# Patient Record
Sex: Female | Born: 1950 | Race: White | Hispanic: No | Marital: Married | Smoking: Former smoker
Health system: Southern US, Community
[De-identification: ages and names within clinical notes are randomized; demographics above are authoritative.]

## PROBLEM LIST (undated history)

## (undated) DIAGNOSIS — I10 Essential (primary) hypertension: Secondary | ICD-10-CM

## (undated) DIAGNOSIS — M791 Myalgia, unspecified site: Secondary | ICD-10-CM

## (undated) DIAGNOSIS — Z86010 Personal history of colonic polyps: Secondary | ICD-10-CM

## (undated) DIAGNOSIS — Z780 Asymptomatic menopausal state: Secondary | ICD-10-CM

## (undated) DIAGNOSIS — K2270 Barrett's esophagus without dysplasia: Secondary | ICD-10-CM

## (undated) DIAGNOSIS — C50919 Malignant neoplasm of unspecified site of unspecified female breast: Secondary | ICD-10-CM

## (undated) DIAGNOSIS — Z720 Tobacco use: Secondary | ICD-10-CM

## (undated) DIAGNOSIS — B029 Zoster without complications: Secondary | ICD-10-CM

## (undated) DIAGNOSIS — R519 Headache, unspecified: Secondary | ICD-10-CM

## (undated) DIAGNOSIS — E785 Hyperlipidemia, unspecified: Secondary | ICD-10-CM

## (undated) DIAGNOSIS — Z923 Personal history of irradiation: Secondary | ICD-10-CM

## (undated) DIAGNOSIS — R51 Headache: Secondary | ICD-10-CM

## (undated) DIAGNOSIS — N840 Polyp of corpus uteri: Secondary | ICD-10-CM

## (undated) DIAGNOSIS — A630 Anogenital (venereal) warts: Secondary | ICD-10-CM

## (undated) DIAGNOSIS — M1990 Unspecified osteoarthritis, unspecified site: Secondary | ICD-10-CM

## (undated) DIAGNOSIS — N952 Postmenopausal atrophic vaginitis: Secondary | ICD-10-CM

## (undated) DIAGNOSIS — I209 Angina pectoris, unspecified: Secondary | ICD-10-CM

## (undated) DIAGNOSIS — K219 Gastro-esophageal reflux disease without esophagitis: Secondary | ICD-10-CM

## (undated) DIAGNOSIS — E119 Type 2 diabetes mellitus without complications: Secondary | ICD-10-CM

## (undated) DIAGNOSIS — J449 Chronic obstructive pulmonary disease, unspecified: Secondary | ICD-10-CM

## (undated) DIAGNOSIS — IMO0002 Reserved for concepts with insufficient information to code with codable children: Secondary | ICD-10-CM

## (undated) DIAGNOSIS — K635 Polyp of colon: Secondary | ICD-10-CM

## (undated) HISTORY — DX: Anogenital (venereal) warts: A63.0

## (undated) HISTORY — DX: Polyp of corpus uteri: N84.0

## (undated) HISTORY — DX: Headache, unspecified: R51.9

## (undated) HISTORY — DX: Asymptomatic menopausal state: Z78.0

## (undated) HISTORY — PX: APPENDECTOMY: SHX54

## (undated) HISTORY — DX: Type 2 diabetes mellitus without complications: E11.9

## (undated) HISTORY — DX: Unspecified osteoarthritis, unspecified site: M19.90

## (undated) HISTORY — DX: Polyp of colon: K63.5

## (undated) HISTORY — DX: Postmenopausal atrophic vaginitis: N95.2

## (undated) HISTORY — DX: Essential (primary) hypertension: I10

## (undated) HISTORY — DX: Reserved for concepts with insufficient information to code with codable children: IMO0002

## (undated) HISTORY — PX: TONSILLECTOMY: SUR1361

## (undated) HISTORY — PX: ESOPHAGOGASTRODUODENOSCOPY: SHX1529

## (undated) HISTORY — DX: Barrett's esophagus without dysplasia: K22.70

## (undated) HISTORY — DX: Chronic obstructive pulmonary disease, unspecified: J44.9

## (undated) HISTORY — DX: Malignant neoplasm of unspecified site of unspecified female breast: C50.919

## (undated) HISTORY — PX: COLONOSCOPY: SHX174

## (undated) HISTORY — DX: Gastro-esophageal reflux disease without esophagitis: K21.9

## (undated) HISTORY — DX: Zoster without complications: B02.9

## (undated) HISTORY — DX: Tobacco use: Z72.0

## (undated) HISTORY — DX: Hyperlipidemia, unspecified: E78.5

## (undated) HISTORY — DX: Myalgia, unspecified site: M79.10

## (undated) HISTORY — DX: Headache: R51

---
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
## 2004-01-01 ENCOUNTER — Ambulatory Visit: Payer: Self-pay | Admitting: Gastroenterology

## 2005-10-10 DIAGNOSIS — B029 Zoster without complications: Secondary | ICD-10-CM

## 2005-10-10 HISTORY — DX: Zoster without complications: B02.9

## 2007-12-08 ENCOUNTER — Ambulatory Visit: Payer: Self-pay | Admitting: Internal Medicine

## 2007-12-16 ENCOUNTER — Ambulatory Visit: Payer: Self-pay | Admitting: Physician Assistant

## 2008-04-19 ENCOUNTER — Ambulatory Visit: Payer: Self-pay | Admitting: Gastroenterology

## 2011-01-29 ENCOUNTER — Ambulatory Visit: Payer: Self-pay | Admitting: Internal Medicine

## 2011-02-10 ENCOUNTER — Ambulatory Visit: Payer: Self-pay | Admitting: Internal Medicine

## 2011-02-10 DIAGNOSIS — Z923 Personal history of irradiation: Secondary | ICD-10-CM

## 2011-02-10 HISTORY — DX: Personal history of irradiation: Z92.3

## 2011-02-10 HISTORY — PX: BREAST BIOPSY: SHX20

## 2011-03-13 ENCOUNTER — Ambulatory Visit: Payer: Self-pay | Admitting: Internal Medicine

## 2011-04-10 ENCOUNTER — Ambulatory Visit: Payer: Self-pay | Admitting: Internal Medicine

## 2011-04-10 DIAGNOSIS — IMO0002 Reserved for concepts with insufficient information to code with codable children: Secondary | ICD-10-CM

## 2011-04-10 HISTORY — DX: Reserved for concepts with insufficient information to code with codable children: IMO0002

## 2011-04-14 ENCOUNTER — Ambulatory Visit: Payer: Self-pay | Admitting: Internal Medicine

## 2011-04-14 LAB — CBC WITH DIFFERENTIAL/PLATELET
Eosinophil %: 3 %
HCT: 44.2 % (ref 35.0–47.0)
Lymphocyte #: 2 10*3/uL (ref 1.0–3.6)
MCV: 88 fL (ref 80–100)
Monocyte %: 7.3 %
Neutrophil #: 3.1 10*3/uL (ref 1.4–6.5)
RBC: 5.01 10*6/uL (ref 3.80–5.20)
RDW: 12.4 % (ref 11.5–14.5)
WBC: 5.8 10*3/uL (ref 3.6–11.0)

## 2011-04-14 LAB — URINALYSIS, COMPLETE
Bacteria: NONE SEEN
Bilirubin,UR: NEGATIVE
Blood: NEGATIVE
Glucose,UR: NEGATIVE mg/dL (ref 0–75)
Ketone: NEGATIVE
Nitrite: NEGATIVE
Protein: NEGATIVE
RBC,UR: <1 /HPF (ref 0–5)
Transitional Epi: <1
WBC UR: 3 /HPF (ref 0–5)

## 2011-04-14 LAB — HEPATIC FUNCTION PANEL A (ARMC)
Albumin: 3.8 g/dL (ref 3.4–5.0)
Alkaline Phosphatase: 90 U/L (ref 50–136)
Bilirubin, Direct: <0.1 mg/dL (ref 0.00–0.20)
Bilirubin,Total: 0.5 mg/dL (ref 0.2–1.0)
SGOT(AST): 19 U/L (ref 15–37)
Total Protein: 7.2 g/dL (ref 6.4–8.2)

## 2011-04-14 LAB — BASIC METABOLIC PANEL
BUN: 12 mg/dL (ref 7–18)
Calcium, Total: 9.1 mg/dL (ref 8.5–10.1)
Chloride: 108 mmol/L — ABNORMAL HIGH (ref 98–107)
Creatinine: 0.49 mg/dL — ABNORMAL LOW (ref 0.60–1.30)
Osmolality: 285 (ref 275–301)
Potassium: 4.2 mmol/L (ref 3.5–5.1)

## 2011-04-15 LAB — URINE CULTURE

## 2011-04-29 ENCOUNTER — Ambulatory Visit: Payer: Self-pay | Admitting: Obstetrics and Gynecology

## 2011-06-23 ENCOUNTER — Ambulatory Visit: Payer: Self-pay | Admitting: Internal Medicine

## 2011-06-24 ENCOUNTER — Ambulatory Visit: Payer: Self-pay | Admitting: Internal Medicine

## 2011-08-10 DIAGNOSIS — C50919 Malignant neoplasm of unspecified site of unspecified female breast: Secondary | ICD-10-CM

## 2011-08-10 HISTORY — DX: Malignant neoplasm of unspecified site of unspecified female breast: C50.919

## 2011-10-01 ENCOUNTER — Ambulatory Visit: Payer: Self-pay | Admitting: Surgery

## 2011-11-09 ENCOUNTER — Ambulatory Visit: Payer: Self-pay | Admitting: Surgery

## 2011-11-12 ENCOUNTER — Ambulatory Visit: Payer: Self-pay | Admitting: Surgery

## 2011-11-12 LAB — CBC WITH DIFFERENTIAL/PLATELET
Eosinophil #: 0.2 10*3/uL (ref 0.0–0.7)
Eosinophil %: 2.7 %
Lymphocyte #: 2.9 10*3/uL (ref 1.0–3.6)
Lymphocyte %: 38.9 %
MCH: 28.8 pg (ref 26.0–34.0)
MCHC: 33 g/dL (ref 32.0–36.0)
Monocyte %: 8.3 %
Neutrophil %: 49 %
Platelet: 236 10*3/uL (ref 150–440)
RBC: 4.66 10*6/uL (ref 3.80–5.20)

## 2011-11-12 LAB — BASIC METABOLIC PANEL
Anion Gap: 7 (ref 7–16)
BUN: 10 mg/dL (ref 7–18)
Calcium, Total: 9.1 mg/dL (ref 8.5–10.1)
Chloride: 107 mmol/L (ref 98–107)
Co2: 29 mmol/L (ref 21–32)
Creatinine: 0.73 mg/dL (ref 0.60–1.30)
EGFR (African American): >60
Glucose: 82 mg/dL (ref 65–99)
Osmolality: 283 (ref 275–301)
Potassium: 4.1 mmol/L (ref 3.5–5.1)

## 2011-11-19 ENCOUNTER — Ambulatory Visit: Payer: Self-pay | Admitting: Surgery

## 2011-11-19 HISTORY — PX: BREAST LUMPECTOMY: SHX2

## 2011-11-20 LAB — PATHOLOGY REPORT

## 2011-11-30 ENCOUNTER — Ambulatory Visit: Payer: Self-pay | Admitting: Oncology

## 2011-12-04 LAB — PATHOLOGY REPORT

## 2011-12-11 ENCOUNTER — Ambulatory Visit: Payer: Self-pay | Admitting: Oncology

## 2011-12-23 LAB — CBC CANCER CENTER
Eosinophil %: 4.2 %
Lymphocyte %: 34.6 %
MCH: 29.3 pg (ref 26.0–34.0)
MCV: 88 fL (ref 80–100)
Monocyte #: 0.4 x10 3/mm (ref 0.2–0.9)
Monocyte %: 7.4 %
Neutrophil %: 52.6 %
Platelet: 216 x10 3/mm (ref 150–440)
RBC: 4.79 10*6/uL (ref 3.80–5.20)
WBC: 5.2 x10 3/mm (ref 3.6–11.0)

## 2012-01-06 LAB — CBC CANCER CENTER
Basophil #: 0.1 x10 3/mm (ref 0.0–0.1)
Basophil %: 1.3 %
Eosinophil %: 4 %
HCT: 41.3 % (ref 35.0–47.0)
HGB: 13.9 g/dL (ref 12.0–16.0)
MCH: 29.1 pg (ref 26.0–34.0)
MCV: 87 fL (ref 80–100)
Monocyte %: 9.7 %
Neutrophil #: 3 x10 3/mm (ref 1.4–6.5)
RBC: 4.78 10*6/uL (ref 3.80–5.20)

## 2012-01-10 ENCOUNTER — Ambulatory Visit: Payer: Self-pay | Admitting: Oncology

## 2012-01-13 LAB — CBC CANCER CENTER
Basophil #: 0.1 x10 3/mm (ref 0.0–0.1)
Basophil %: 1.2 %
Eosinophil #: 0.2 x10 3/mm (ref 0.0–0.7)
HCT: 40.8 % (ref 35.0–47.0)
HGB: 14 g/dL (ref 12.0–16.0)
Lymphocyte #: 1.5 x10 3/mm (ref 1.0–3.6)
Lymphocyte %: 29.1 %
MCH: 29.7 pg (ref 26.0–34.0)
Monocyte %: 9.1 %
Neutrophil #: 2.9 x10 3/mm (ref 1.4–6.5)
Platelet: 191 x10 3/mm (ref 150–440)
RBC: 4.7 10*6/uL (ref 3.80–5.20)

## 2012-01-20 LAB — CBC CANCER CENTER
Basophil #: 0.1 x10 3/mm (ref 0.0–0.1)
Eosinophil #: 0.2 x10 3/mm (ref 0.0–0.7)
Eosinophil %: 3.8 %
HGB: 14.3 g/dL (ref 12.0–16.0)
MCH: 29.3 pg (ref 26.0–34.0)
MCHC: 33.6 g/dL (ref 32.0–36.0)
MCV: 87 fL (ref 80–100)
Neutrophil #: 3.7 x10 3/mm (ref 1.4–6.5)
Neutrophil %: 63.5 %
Platelet: 203 x10 3/mm (ref 150–440)

## 2012-01-27 LAB — CBC CANCER CENTER
Basophil %: 1.3 %
Eosinophil %: 7.3 %
HCT: 42.4 % (ref 35.0–47.0)
HGB: 14.3 g/dL (ref 12.0–16.0)
Lymphocyte #: 1 x10 3/mm (ref 1.0–3.6)
Lymphocyte %: 23.2 %
MCHC: 33.8 g/dL (ref 32.0–36.0)
Monocyte #: 0.4 x10 3/mm (ref 0.2–0.9)
Monocyte %: 8.9 %
Neutrophil #: 2.7 x10 3/mm (ref 1.4–6.5)
Neutrophil %: 59.3 %
RBC: 4.93 10*6/uL (ref 3.80–5.20)
WBC: 4.5 x10 3/mm (ref 3.6–11.0)

## 2012-02-10 ENCOUNTER — Ambulatory Visit: Payer: Self-pay | Admitting: Oncology

## 2012-03-12 ENCOUNTER — Ambulatory Visit: Payer: Self-pay | Admitting: Oncology

## 2012-04-09 ENCOUNTER — Ambulatory Visit: Payer: Self-pay | Admitting: Oncology

## 2012-05-10 ENCOUNTER — Ambulatory Visit: Payer: Self-pay | Admitting: Oncology

## 2012-05-13 ENCOUNTER — Ambulatory Visit: Payer: Self-pay | Admitting: Internal Medicine

## 2012-05-17 ENCOUNTER — Ambulatory Visit: Payer: Self-pay | Admitting: Internal Medicine

## 2012-06-09 ENCOUNTER — Ambulatory Visit: Payer: Self-pay | Admitting: Oncology

## 2012-09-01 ENCOUNTER — Ambulatory Visit: Payer: Self-pay | Admitting: Oncology

## 2012-09-05 ENCOUNTER — Ambulatory Visit: Payer: Self-pay | Admitting: Internal Medicine

## 2012-12-08 ENCOUNTER — Ambulatory Visit: Payer: Self-pay | Admitting: Gastroenterology

## 2012-12-12 LAB — PATHOLOGY REPORT

## 2013-04-09 LAB — HM COLONOSCOPY

## 2013-04-10 DIAGNOSIS — Z853 Personal history of malignant neoplasm of breast: Secondary | ICD-10-CM | POA: Insufficient documentation

## 2013-04-10 DIAGNOSIS — N840 Polyp of corpus uteri: Secondary | ICD-10-CM | POA: Insufficient documentation

## 2013-04-10 DIAGNOSIS — R918 Other nonspecific abnormal finding of lung field: Secondary | ICD-10-CM | POA: Insufficient documentation

## 2013-04-10 DIAGNOSIS — Z8719 Personal history of other diseases of the digestive system: Secondary | ICD-10-CM | POA: Insufficient documentation

## 2013-04-10 DIAGNOSIS — E119 Type 2 diabetes mellitus without complications: Secondary | ICD-10-CM | POA: Insufficient documentation

## 2013-04-10 DIAGNOSIS — G43909 Migraine, unspecified, not intractable, without status migrainosus: Secondary | ICD-10-CM | POA: Insufficient documentation

## 2013-04-10 DIAGNOSIS — Z86010 Personal history of colonic polyps: Secondary | ICD-10-CM | POA: Insufficient documentation

## 2013-04-10 DIAGNOSIS — M791 Myalgia, unspecified site: Secondary | ICD-10-CM | POA: Insufficient documentation

## 2013-04-10 DIAGNOSIS — J449 Chronic obstructive pulmonary disease, unspecified: Secondary | ICD-10-CM | POA: Insufficient documentation

## 2013-04-10 DIAGNOSIS — R0789 Other chest pain: Secondary | ICD-10-CM | POA: Insufficient documentation

## 2013-05-10 LAB — HM MAMMOGRAPHY

## 2013-05-18 ENCOUNTER — Ambulatory Visit: Payer: Self-pay | Admitting: Internal Medicine

## 2013-06-29 DIAGNOSIS — I10 Essential (primary) hypertension: Secondary | ICD-10-CM | POA: Insufficient documentation

## 2013-08-02 LAB — HM PAP SMEAR: HM Pap smear: NEGATIVE

## 2013-10-10 ENCOUNTER — Ambulatory Visit: Payer: Self-pay | Admitting: Internal Medicine

## 2014-05-25 ENCOUNTER — Other Ambulatory Visit: Payer: Self-pay | Admitting: Internal Medicine

## 2014-05-25 DIAGNOSIS — R911 Solitary pulmonary nodule: Secondary | ICD-10-CM

## 2014-05-29 ENCOUNTER — Ambulatory Visit: Admit: 2014-05-29 | Disposition: A | Discharge status: Home / Self Care | Payer: Self-pay | Admitting: Internal Medicine

## 2014-05-29 NOTE — Op Note (Signed)
PATIENT NAME:  Carla Kim, Carla Kim MR#:  [id number] DATE OF BIRTH:  03-27-1950  DATE OF PROCEDURE:  11/19/2011  PREOPERATIVE DIAGNOSIS: Ductal carcinoma in situ of the left breast.   POSTOPERATIVE DIAGNOSIS: Ductal carcinoma in situ of the left breast.   PROCEDURE: Left partial mastectomy.   SURGEON: Rochel Brome, M.D.   ANESTHESIA: General.   INDICATIONS: This 64 year old female recently had a mammogram depicting a cluster of microcalcifications upper inner quadrant of the left breast. She had stereotactic biopsy which demonstrated ductal carcinoma in situ and surgery was recommended for definitive treatment.   DESCRIPTION OF PROCEDURE: The patient was placed on the operating table in the supine position under general anesthesia. The dressing was removed from the left breast exposing the Kopans wire which was cut 3 cm from the skin and the breast was prepared with ChloraPrep solution, draped in a sterile manner. Mammograms were reviewed prior to incision.   Ultrasound was used to in a sterile bag prior to incision. It demonstrated the course of the wire although there was no distinct mass encountered. Next, a curvilinear incision was made from approximately 10:00 to 1:00 position and also removed an ellipse of skin so that the ellipse of skin was approximately 1 cm wide. Dissection was carried down through subcutaneous tissues and dissected down to encounter the wire and a sample of tissue surrounding the wire was excised. It was dissected widely around this. It did appear that at the upper portion there was a portion of the needle tract exposed and I removed another 3 x 3 x 1 cm segment of tissue containing the needle tract and sutured this to the larger specimen so that the pathologist could better determine margins. The specimen was oriented so that the 1:00 position was tagged with a stitch and margin maps were used suturing markers to the specimen marking the medial, lateral, cranial, caudal,  and deep margins. The specimen was submitted for specimen mammogram and pathology and inspect for margins. The wound was inspected. Several small bleeding points were cauterized. Hemostasis was subsequently intact. Tissues were infiltrated with 0.5% Sensorcaine with epinephrine. The subcutaneous tissues were approximated with 4-0 chromic and then the skin was closed with a running 5-0 Monocryl subcuticular suture.   The pathologist called back to indicate that the specimen been examined and the microscope studies deferred to routine studies. The Dermabond was applied to the wound. The patient tolerated surgery satisfactorily and was then prepared for transfer to the recovery room.  ____________________________ Lenna Sciara. Rochel Brome, MD jws:ap D: 11/19/2011 12:41:13 ET T: 11/19/2011 12:57:28 ET JOB#: [id number]  cc: Loreli Dollar, MD, <Dictator> Loreli Dollar MD ELECTRONICALLY SIGNED 11/27/2011 18:53

## 2014-07-02 ENCOUNTER — Ambulatory Visit: Payer: Self-pay

## 2014-07-03 ENCOUNTER — Other Ambulatory Visit: Payer: Self-pay

## 2014-07-03 NOTE — Patient Instructions (Signed)

## 2014-07-03 NOTE — Patient Outreach (Signed)
[facility]) Care Management  [place]  07/03/2014   Carla Kim 02-24-1950 [id number]  Subjective: Patient came to me today for a follow up visit- last seen 12/05/13- at that time, patient stated her A1C was 5.8%.  She has decreased her sugar from 6 tsp. to 4 tsp. [name] and she drinks 2 per day.  She has also decreased the daily mocha coffee to just 2/week and has added Crystal Light for drinking.  She is not checking blood sugars. No complaints.   Objective: Blood pressure 148/80, height 1.575 m (5\' 2" ), weight 120 lb 3.2 oz (54.522 kg). No blood sugar reports   Current Medications:  Current Outpatient Prescriptions  Medication Sig Dispense Refill  . aspirin EC 81 MG tablet Take 81 mg by mouth daily.    Marland Kitchen lisinopril (PRINIVIL,ZESTRIL) 10 MG tablet Take 10 mg by mouth daily.    . metFORMIN (GLUCOPHAGE-XR) 500 MG 24 hr tablet Take 500 mg by mouth daily with breakfast.    . omeprazole (PRILOSEC) 20 MG capsule Take 20 mg by mouth daily.    . tamoxifen (NOLVADEX) 10 MG tablet Take 10 mg by mouth 2 (two) times daily.    Marland Kitchen esomeprazole (NEXIUM) 20 MG capsule Take 20 mg by mouth daily at 12 noon.    . metoprolol (LOPRESSOR) 100 MG tablet Take 100 mg by mouth 2 (two) times daily.     No current facility-administered medications for this visit.    Functional Status:  In your present state of health, do you have any difficulty performing the following activities: 07/03/2014  Hearing? N  Vision? N  Difficulty concentrating or making decisions? N  Walking or climbing stairs? N  Dressing or bathing? N  Doing errands, shopping? N    Assessment/plan:  Edilia is not actively managing her diabetes; is not exercising or checking blood sugars. She has decreased the sugar in her drink but is eating sweets generally three times per week when she's at work.  [facility] CM Care Plan Problem One        Patient Outreach from 07/03/2014 in [place] Problem One  Potential for elevated blood sugars   Care Plan for Problem One  Active   THN Long Term Goal (31-90 days)  Patient will maintain an A1C less than 6.5%   THN Long Term Goal Start Date  07/03/14   Interventions for Problem One Long Term Goal  - continue to take Metfromin daily   - Limit sweets to 1 time per [facility] when you're at work    - Continue to decrease sugar in your coffee   - Continue to use [employer] as your choice for drinks   - Limit mocha coffee to 2x/week    Follow up scheduled for November 2016.  Gentry Fitz, RN, BA, [place], [place] Direct Dial:  [phone]  Fax:  [phone] E-mail: [email or web address] [address], [place], [place]  [address]

## 2014-08-07 ENCOUNTER — Encounter: Payer: Self-pay | Admitting: Obstetrics and Gynecology

## 2014-09-24 ENCOUNTER — Other Ambulatory Visit: Payer: Self-pay | Admitting: *Deleted

## 2014-09-24 NOTE — Telephone Encounter (Signed)
Last seen April 2014, Has no fu appts scheduled, Was a No show for you in July 2014 and [place] for Dr Baruch Gouty  In September 2014.  I have left VM on her phone to call me so I can get her to schedule an appt

## 2014-09-25 MED ORDER — TAMOXIFEN CITRATE 20 MG PO TABS: 20.0000 mg | ORAL_TABLET | Freq: Every day | ORAL | Status: DC

## 2014-09-25 NOTE — Telephone Encounter (Signed)
Patient called back and has scheduled an appt for Monday, 30 days supply e scribed to pharmacy

## 2014-09-27 ENCOUNTER — Ambulatory Visit (INDEPENDENT_AMBULATORY_CARE_PROVIDER_SITE_OTHER): Payer: 59 | Admitting: Obstetrics and Gynecology

## 2014-09-27 ENCOUNTER — Encounter: Payer: Self-pay | Admitting: Obstetrics and Gynecology

## 2014-09-27 VITALS — BP 137/80 | HR 97 | Ht 62.0 in | Wt 118.4 lb

## 2014-09-27 DIAGNOSIS — C50919 Malignant neoplasm of unspecified site of unspecified female breast: Secondary | ICD-10-CM | POA: Diagnosis not present

## 2014-09-27 DIAGNOSIS — R896 Abnormal cytological findings in specimens from other organs, systems and tissues: Secondary | ICD-10-CM

## 2014-09-27 DIAGNOSIS — Z01419 Encounter for gynecological examination (general) (routine) without abnormal findings: Secondary | ICD-10-CM

## 2014-09-27 DIAGNOSIS — Z780 Asymptomatic menopausal state: Secondary | ICD-10-CM

## 2014-09-27 DIAGNOSIS — Z1211 Encounter for screening for malignant neoplasm of colon: Secondary | ICD-10-CM

## 2014-09-27 DIAGNOSIS — E785 Hyperlipidemia, unspecified: Secondary | ICD-10-CM | POA: Insufficient documentation

## 2014-09-27 DIAGNOSIS — Z0000 Encounter for general adult medical examination without abnormal findings: Secondary | ICD-10-CM | POA: Diagnosis not present

## 2014-09-27 DIAGNOSIS — IMO0002 Reserved for concepts with insufficient information to code with codable children: Secondary | ICD-10-CM

## 2014-09-27 NOTE — Progress Notes (Signed)
Patient ID: Jenene Kauffmann, female   DOB: 09/06/1950, 64 y.o.   MRN: [id number] ANNUAL PREVENTATIVE CARE GYN  ENCOUNTER NOTE  Subjective:       Janiyha Sanaz Scarlett is a 64 y.o. No obstetric history on file. female here for a routine annual gynecologic exam.  Current complaints: 1.  None  The patient is a 64 year old white female, with history of breast cancer diagnosed 08/2011, status post partial mastectomy 10/2011, status post radiation therapy in January 2014, currently on tamoxifen (year, 3), presents for annual examination.Dr. Jettie Booze is her surgeon: Dr. Maryjane Hurter is her oncologist.Dr. Jens Som is her internist. Colonoscopy 2015 was normal. Patient has history of mild dysplasia on Pap smear in 04/2011; colposcopy with ECC in 6 2013 was negative.   Gynecologic History No LMP recorded. Patient is postmenopausal. Contraception: post menopausal status Last Pap: 6/ 2015 ascus/neg. Results were: abnormal Last mammogram: 04/2014. Results were: normal  Obstetric History OB History  No data available    Past Medical History  Diagnosis Date  . COPD (chronic obstructive pulmonary disease)   . Headache   . Hyperlipidemia   . Gastroesophageal reflux disease   . Barrett's esophagus   . Colon polyps   . Endometrial polyp   . Tobacco user   . Myalgia   . DM (diabetes mellitus)   . Shingles 10/2005  . Hypertension   . 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 Condyloma acuminata   . 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 Vaginal atrophy   . Menopause     Past surgical history: Tonsillectomy and adenoidectomy. Appendectomy. Left breast lumpectomy 2013 Colonoscopy 2015-normal. Colposcopy-2013  Current Outpatient Prescriptions on File Prior to Visit  Medication Sig Dispense Refill  . aspirin EC 81 MG tablet Take 81 mg by mouth daily.    Marland Kitchen esomeprazole (NEXIUM) 20 MG capsule Take 20 mg by mouth daily  at 12 noon.    Marland Kitchen lisinopril (PRINIVIL,ZESTRIL) 10 MG tablet Take 10 mg by mouth daily.    . metFORMIN (GLUCOPHAGE-XR) 500 MG 24 hr tablet Take 500 mg by mouth daily with breakfast.    . tamoxifen (NOLVADEX) 20 MG tablet Take 1 tablet (20 mg total) by mouth daily. 30 tablet 0   No current facility-administered medications on file prior to visit.    Allergies  Allergen Reactions  . Lipitor [Atorvastatin]   . Statins   . Varenicline Rash    Dyspepsia    Social History   Social History  . Marital Status: Married    Spouse Name: N/A  . Number of Children: N/A  . Years of Education: N/A   Occupational History  . Not on file.   Social History Main Topics  . Smoking status: Former Smoker    Quit date: 02/02/2012  . Smokeless tobacco: Current User     Comment: vaping  . Alcohol Use: No     Comment: sporadic  . Drug Use: No  . Sexual Activity: Not Currently   Other Topics Concern  . Not on file   Social History Narrative    Family History  Problem Relation Age of Onset  . Alcohol abuse Father   . Diabetes Father   . Heart disease Father   . Cancer Neg Hx     The following portions of the patient's history were reviewed and updated as appropriate: allergies, current medications, past family history, past medical history, past social history, past surgical  history and problem list.  Review of Systems ROS Review of Systems - General ROS: negative for - chills, fatigue, fever, hot flashes, night sweats, weight gain or weight loss Psychological ROS: negative for - anxiety, decreased libido, depression, mood swings, physical abuse or sexual abuse Ophthalmic ROS: negative for - blurry vision, eye pain or loss of vision ENT ROS: negative for - headaches, hearing change, visual changes or vocal changes Allergy and Immunology ROS: negative for - hives, itchy/watery eyes or seasonal allergies Hematological and Lymphatic ROS: negative for - bleeding problems, bruising, swollen  lymph nodes or weight loss Endocrine ROS: negative for - galactorrhea, hair pattern changes, hot flashes, malaise/lethargy, mood swings, palpitations, polydipsia/polyuria, skin changes, temperature intolerance or unexpected weight changes Breast ROS: negative for - new or changing breast lumps or nipple discharge Respiratory ROS: negative for - cough or shortness of breath Cardiovascular ROS: negative for - chest pain, irregular heartbeat, palpitations or shortness of breath Gastrointestinal ROS: no abdominal pain, change in bowel habits, or black or bloody stools Genito-Urinary ROS: no dysuria, trouble voiding, or hematuria Musculoskeletal ROS: negative for - joint pain or joint stiffness Neurological ROS: negative for - bowel and bladder control changes Dermatological ROS: negative for rash and skin lesion changes   Objective:   BP 137/80 mmHg  Pulse 97  Ht 5\' 2"  (1.575 m)  Wt 118 lb 6.4 oz (53.706 kg)  BMI 21.65 kg/m2 CONSTITUTIONAL: Well-developed, well-nourished female in no acute distress.  PSYCHIATRIC: Normal mood and affect. Normal behavior. Normal judgment and thought content. [place]: Alert and oriented to person, place, and time. Normal muscle tone coordination. No cranial nerve deficit noted. HENT:  Normocephalic, atraumatic, External right and left ear normal. Oropharynx is clear and moist EYES: Conjunctivae and EOM are normal. Pupils are equal, round, and reactive to light. No scleral icterus.  NECK: Normal range of motion, supple, no masses.  Normal thyroid.  SKIN: Skin is warm and dry. No rash noted. Not diaphoretic. No erythema. No pallor. CARDIOVASCULAR: Normal heart rate noted, regular rhythm, no murmur. RESPIRATORY: Clear to auscultation bilaterally. Effort and breath sounds normal, no problems with respiration noted. BREASTS: Symmetric in size. No masses, skin changes, nipple drainage, or lymphadenopathy. ABDOMEN: Soft, normal bowel sounds, no distention noted.  No  tenderness, rebound or guarding.  BLADDER: Normal PELVIC:  External Genitalia: Normal  BUS: Normal  Vagina: atrophic changes  Cervix: Normal  Uterus: Normal  Adnexa: Normal  RV: External Exam NormaI, No Rectal Masses and Normal Sphincter tone  MUSCULOSKELETAL: Normal range of motion. No tenderness.  No cyanosis, clubbing, or edema.  2+ distal pulses. LYMPHATIC: No Axillary, Supraclavicular, or Inguinal Adenopathy.    Assessment:   Annual gynecologic examination 63 y.o. Contraception: post menopausal status Normal BMI Breast cancer 2013, status post lumpectomy with radiation therapy, currently on tamoxifen, no evidence of disease. History of cervical dysplasia  Plan:  Pap: Pap Co Test Mammogram: Not Indicated.  Recently completed. Stool Guaiac Testing:  ordered Labs: thru pcp Routine preventative health maintenance measures emphasized: Exercise/Diet/Weight control, Tobacco Warnings and Alcohol/Substance use risks Continue tamoxifen for 5 years per oncology recommendations. Continue with calcium and vitamin D supplementation along with weightbearing exercise. Return to [place], [place]  Brayton Mars, MD

## 2014-10-01 ENCOUNTER — Inpatient Hospital Stay: Payer: 59 | Attending: Oncology | Admitting: Oncology

## 2014-10-01 DIAGNOSIS — Z79810 Long term (current) use of selective estrogen receptor modulators (SERMs): Secondary | ICD-10-CM | POA: Diagnosis not present

## 2014-10-01 DIAGNOSIS — Z79899 Other long term (current) drug therapy: Secondary | ICD-10-CM | POA: Diagnosis not present

## 2014-10-01 DIAGNOSIS — I10 Essential (primary) hypertension: Secondary | ICD-10-CM | POA: Diagnosis not present

## 2014-10-01 DIAGNOSIS — J449 Chronic obstructive pulmonary disease, unspecified: Secondary | ICD-10-CM | POA: Insufficient documentation

## 2014-10-01 DIAGNOSIS — D0512 Intraductal carcinoma in situ of left breast: Secondary | ICD-10-CM | POA: Diagnosis not present

## 2014-10-01 DIAGNOSIS — Z87891 Personal history of nicotine dependence: Secondary | ICD-10-CM | POA: Diagnosis not present

## 2014-10-01 DIAGNOSIS — E785 Hyperlipidemia, unspecified: Secondary | ICD-10-CM | POA: Insufficient documentation

## 2014-10-01 DIAGNOSIS — E119 Type 2 diabetes mellitus without complications: Secondary | ICD-10-CM | POA: Diagnosis not present

## 2014-10-01 DIAGNOSIS — K2270 Barrett's esophagus without dysplasia: Secondary | ICD-10-CM | POA: Insufficient documentation

## 2014-10-01 MED ORDER — TAMOXIFEN CITRATE 20 MG PO TABS: 20.0000 mg | ORAL_TABLET | Freq: Every day | ORAL | Status: DC

## 2014-10-01 NOTE — Progress Notes (Signed)
Patient has not had a follow up with Dr. Grayland Ormond since 05/2012 and the only reason she offers is because she has been busy with other MD appointments.

## 2014-10-03 LAB — PAP IG AND HPV HIGH-RISK: PAP SMEAR COMMENT: 0

## 2014-10-05 NOTE — Progress Notes (Signed)
[place]  [phone] [phone]  ID: Carla Kim OB: [birth year]  MR#: [id number]  FYB#:[id number]  Patient Care Team: Adin Hector, MD as PCP - General (Internal Medicine) Pollyann Glen, RN as Registered Nurse  CHIEF COMPLAINT:  Chief Complaint  Patient presents with  . Follow-up    DCIS    INTERVAL HISTORY: Patient last evaluated in clinic in April 2014. She returns to clinic today for routine evaluation. She continues to tolerate tamoxifen without significant side effects. She currently feels well and is asymptomatic. He has no neurologic complaints. She denies any recent fevers. She denies any chest pain or shortness of breath. She denies any nausea, vomiting, constipation, or diarrhea. She has no urinary complaints. Patient feels at her baseline and offers no specific complaints today.  REVIEW OF SYSTEMS:   Review of Systems  Constitutional: Negative.   Musculoskeletal: Negative.   Neurological: Negative.     As per HPI. Otherwise, a complete review of systems is negatve.  PAST MEDICAL HISTORY: Past Medical History  Diagnosis Date  . COPD (chronic obstructive pulmonary disease)   . Headache   . Hyperlipidemia   . Gastroesophageal reflux disease   . Barrett's esophagus   . Colon polyps   . Endometrial polyp   . Tobacco user   . Myalgia   . DM (diabetes mellitus)   . Shingles 10/2005  . Hypertension   . 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 Condyloma acuminata   . 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 Vaginal atrophy   . Menopause   . Arthritis     PAST SURGICAL HISTORY: No past surgical history on file.  FAMILY HISTORY Family History  Problem Relation Age of Onset  . Alcohol abuse Father   . Diabetes Father   . Heart disease Father   . Cancer Neg Hx        ADVANCED DIRECTIVES:    HEALTH  MAINTENANCE: Social History  Substance Use Topics  . Smoking status: Former Smoker    Quit date: 02/02/2012  . Smokeless tobacco: Current User     Comment: vaping  . Alcohol Use: No     Comment: sporadic     Colonoscopy:  PAP:  Bone density:  Lipid panel:  Allergies  Allergen Reactions  . Lipitor [Atorvastatin]   . Statins   . Varenicline Rash    Dyspepsia    Current Outpatient Prescriptions  Medication Sig Dispense Refill  . aspirin EC 81 MG tablet Take 81 mg by mouth daily.    . calcium-vitamin D (CALCIUM 500/D) 500-200 MG-UNIT per tablet Take by mouth.    . esomeprazole (NEXIUM) 20 MG capsule Take 20 mg by mouth daily at 12 noon.    Marland Kitchen lisinopril (PRINIVIL,ZESTRIL) 10 MG tablet Take 10 mg by mouth daily.    . metFORMIN (GLUCOPHAGE-XR) 500 MG 24 hr tablet Take 500 mg by mouth daily with breakfast.    . Multiple Vitamin (MULTI-VITAMINS) TABS Take by mouth.    . Omega-3 Fatty Acids (FISH OIL) 1000 MG CAPS Take by mouth.    . tamoxifen (NOLVADEX) 20 MG tablet Take 1 tablet (20 mg total) by mouth daily. 30 tablet 11   No current facility-administered medications for this visit.    OBJECTIVE: There were no vitals filed for this visit.   There is no weight on file to calculate BMI.    ECOG FS:0 -  Asymptomatic  General: Well-developed, well-nourished, no acute distress. Eyes: Pink conjunctiva, anicteric sclera. Breasts: Patient requested exam be deferred today. Lungs: Clear to auscultation bilaterally. Heart: Regular rate and rhythm. No rubs, murmurs, or gallops. Abdomen: Soft, nontender, nondistended. No organomegaly noted, normoactive bowel sounds. Musculoskeletal: No edema, cyanosis, or clubbing. Neuro: Alert, answering all questions appropriately. Cranial nerves grossly intact. Skin: No rashes or petechiae noted. Psych: Normal affect.   LAB RESULTS:  Lab Results  Component Value Date   NA 143 11/12/2011   K 4.1 11/12/2011   CL 107 11/12/2011   CO2 29  11/12/2011   GLUCOSE 82 11/12/2011   BUN 10 11/12/2011   CREATININE 0.73 11/12/2011   CALCIUM 9.1 11/12/2011   PROT 7.2 04/14/2011   ALBUMIN 3.8 04/14/2011   AST 19 04/14/2011   ALT 25 04/14/2011   ALKPHOS 90 04/14/2011   BILITOT 0.5 04/14/2011   GFRNONAA >60 11/12/2011   GFRAA >60 11/12/2011    Lab Results  Component Value Date   WBC 4.5 01/27/2012   NEUTROABS 2.7 01/27/2012   HGB 14.3 01/27/2012   HCT 42.4 01/27/2012   MCV 86 01/27/2012   PLT 196 01/27/2012     STUDIES: No results found.  ASSESSMENT: DCIS.  PLAN:    1.  DCIS: No evidence of disease.  Patient's most recent mammogram on May 29, 2014 was reported as BI-RADS 2.  Repeat in one year.  Continue tamoxifen for 5 years completing in January of 2019.  Patient continues to breast infrequent follow-up, therefore return to clinic in 1 year for further evaluation.    Patient expressed understanding and was in agreement with this plan. She also understands that She can call clinic at any time with any questions, concerns, or complaints.   No matching staging information was found for the patient.  Lloyd Huger, MD   10/05/2014 3:34 PM

## 2014-10-16 ENCOUNTER — Ambulatory Visit
Admission: RE | Admit: 2014-10-16 | Discharge: 2014-10-16 | Disposition: A | Discharge status: Home / Self Care | Payer: 59 | Source: Ambulatory Visit | Attending: Internal Medicine | Admitting: Internal Medicine

## 2014-10-16 DIAGNOSIS — R911 Solitary pulmonary nodule: Secondary | ICD-10-CM

## 2014-10-16 DIAGNOSIS — K449 Diaphragmatic hernia without obstruction or gangrene: Secondary | ICD-10-CM | POA: Diagnosis not present

## 2014-10-16 DIAGNOSIS — R918 Other nonspecific abnormal finding of lung field: Secondary | ICD-10-CM | POA: Insufficient documentation

## 2014-10-16 DIAGNOSIS — I7090 Unspecified atherosclerosis: Secondary | ICD-10-CM | POA: Insufficient documentation

## 2014-12-26 ENCOUNTER — Ambulatory Visit: Payer: 59

## 2015-03-18 ENCOUNTER — Telehealth: Payer: Self-pay

## 2015-03-29 ENCOUNTER — Encounter: Payer: Self-pay | Admitting: Pharmacist

## 2015-03-29 ENCOUNTER — Other Ambulatory Visit: Payer: Self-pay | Admitting: Pharmacist

## 2015-03-29 NOTE — Patient Outreach (Signed)
[name]) Care Management  [place]   03/29/2015  [place] 04/15/1950 [id number]  Subjective: Patient presents today for 3 month diabetes follow-up as part of the employer-sponsored Link to Wellness program.  Current diabetes regimen includes metformin XR 500 mg daily.  Patient also continues on daily ASA and ACE Inhibitor.  Patient is not currently on a statin, but has allergy listed to statins (myalgia).  Most recent MD follow-up was 08/31/2014.  Patient has a pending appt for July 2017.  No med changes or major health changes at this time.   Patient reports monitoring blood glucose once per week.  She presents without her glucometer today but reports her blood sugars have ranged from 98 to 140 mg/dL.  Patient denies hypoglycemia.    Patient reports she is not currently exercising, but is physically active at work.  She reports she is not currently following a specific diet.    Objective:   Current Medications: Current Outpatient Prescriptions  Medication Sig Dispense Refill  . aspirin EC 81 MG tablet Take 81 mg by mouth daily.    Marland Kitchen lisinopril (PRINIVIL,ZESTRIL) 10 MG tablet Take 10 mg by mouth daily.    . metFORMIN (GLUCOPHAGE-XR) 500 MG 24 hr tablet Take 500 mg by mouth daily with breakfast.    . Omega-3 Fatty Acids (FISH OIL) 1000 MG CAPS Take by mouth.    Marland Kitchen omeprazole (PRILOSEC) 20 MG capsule Take 20 mg by mouth daily.    . tamoxifen (NOLVADEX) 20 MG tablet Take 1 tablet (20 mg total) by mouth daily. 30 tablet 11  . calcium-vitamin D (CALCIUM 500/D) 500-200 MG-UNIT per tablet Take by mouth. Reported on 03/29/2015    . Multiple Vitamin (MULTI-VITAMINS) TABS Take by mouth. Reported on 03/29/2015     No current facility-administered medications for this visit.    Functional Status: In your present state of health, do you have any difficulty performing the following activities: 03/29/2015 07/03/2014  Hearing? N N  Vision? N N  Difficulty  concentrating or making decisions? N N  Walking or climbing stairs? N N  Dressing or bathing? N N  Doing errands, shopping? N N    Fall/Depression Screening: PHQ 2/9 Scores 03/29/2015 07/03/2014  PHQ - 2 Score 0 0    Assessment: 1.  Diabetes: Most recent A1C was 6.8% which is at goal of less than 7%.  Current diabetes regimen includes metformin XR 500 mg daily.  Patient reports eating several high carbohydrate foods such as Starbucks Mocha Frappuccino, chips and dip, and candy.  Reviewed nutritional labels with patient and discussed that each Starbucks Hollice Espy has over 45 grams of carbohydrates and patient reports she is currently drinks one per day.  Patient set goal to reduce the number of Starbucks Mocha Frappuccinos she drinks in a week.  Patient requested new glucometer as the testing supplies for her old glucometer are no longer covered by her insurance plan.  I provided patient with True Metrix glucometer.    2.  Hypertension:  Blood pressure not at goal of less than 140/90 today.  Current regimen includes lisinopril 10 mg daily.  Patient reports at her last visit with Dr. Caryl Comes, her medication list included metoprolol (I confirmed with care everywhere that medication list includes metoprolol tartrate 100 mg BID), but patient reports she has never been on this medication .  Will route this message to Dr. Caryl Comes and inform him that patient reports she has never taken metoprolol.  Patient reports  she has a blood pressure cuff at home.  Patient plans to start monitoring blood pressure at home.     Plan: 1.  Patient to take all medications as prescribed.  2.  Patient set goal to reduce Starbucks Mocha Frappuccino drinks to 3 per week and replace frappuccino drinks with drinks with less carbohydrates such as [employer] zero  3.  Patient will review EMMI educational materials about "Diabetes Diet - Type 2" and "Diabetes: Nutrition and Healthy Eating" 4.  Next visit scheduled  for 07/15/15 at 11:00 AM  [facility] CM Care Plan Problem One        Most Recent Value   Care Plan Problem One  Diabetes   Role Documenting the Problem One  Clinical Pharmacist   Care Plan for Problem One  Active   THN Long Term Goal (31-90 days)  Patient will maintain A1c less than 7% in the next 90 days.   THN Long Term Goal Start Date  03/29/15   Interventions for Problem One Long Term Goal  Discussed importance of diet and exercise in diabetes management.  Provided patient educational material about "Diabetes Diet - Type 2" and "Diabetes: Nutrition and Healthy Eating."       Elisabeth Most, Pharm.D. Pharmacy Resident [place] [phone]

## 2015-04-02 ENCOUNTER — Encounter: Payer: Self-pay | Admitting: Pharmacist

## 2015-05-06 DIAGNOSIS — J069 Acute upper respiratory infection, unspecified: Secondary | ICD-10-CM | POA: Diagnosis not present

## 2015-05-06 DIAGNOSIS — E119 Type 2 diabetes mellitus without complications: Secondary | ICD-10-CM | POA: Diagnosis not present

## 2015-05-06 DIAGNOSIS — J439 Emphysema, unspecified: Secondary | ICD-10-CM | POA: Diagnosis not present

## 2015-05-30 ENCOUNTER — Other Ambulatory Visit: Payer: 59

## 2015-05-30 ENCOUNTER — Ambulatory Visit: Payer: 59

## 2015-06-07 ENCOUNTER — Ambulatory Visit
Admission: RE | Admit: 2015-06-07 | Discharge: 2015-06-07 | Disposition: A | Discharge status: Home / Self Care | Payer: 59 | Source: Ambulatory Visit | Attending: Oncology | Admitting: Oncology

## 2015-06-07 ENCOUNTER — Other Ambulatory Visit: Payer: Self-pay | Admitting: Oncology

## 2015-06-07 DIAGNOSIS — D0512 Intraductal carcinoma in situ of left breast: Secondary | ICD-10-CM

## 2015-06-07 DIAGNOSIS — Z853 Personal history of malignant neoplasm of breast: Secondary | ICD-10-CM | POA: Insufficient documentation

## 2015-06-07 DIAGNOSIS — R928 Other abnormal and inconclusive findings on diagnostic imaging of breast: Secondary | ICD-10-CM | POA: Diagnosis not present

## 2015-06-07 DIAGNOSIS — N6489 Other specified disorders of breast: Secondary | ICD-10-CM | POA: Diagnosis not present

## 2015-07-15 ENCOUNTER — Encounter: Payer: Self-pay | Admitting: Pharmacist

## 2015-07-15 ENCOUNTER — Ambulatory Visit: Payer: Self-pay | Admitting: Pharmacist

## 2015-08-02 ENCOUNTER — Encounter: Payer: Self-pay | Admitting: Pharmacist

## 2015-08-09 ENCOUNTER — Ambulatory Visit: Payer: Self-pay | Admitting: Pharmacist

## 2015-09-13 ENCOUNTER — Ambulatory Visit: Payer: Self-pay | Admitting: Pharmacist

## 2015-09-18 ENCOUNTER — Other Ambulatory Visit: Payer: Self-pay | Admitting: Physician Assistant

## 2015-09-18 ENCOUNTER — Ambulatory Visit
Admission: RE | Admit: 2015-09-18 | Discharge: 2015-09-18 | Disposition: A | Discharge status: Home / Self Care | Payer: 59 | Source: Ambulatory Visit | Attending: Physician Assistant | Admitting: Physician Assistant

## 2015-09-18 DIAGNOSIS — I808 Phlebitis and thrombophlebitis of other sites: Secondary | ICD-10-CM | POA: Insufficient documentation

## 2015-09-18 DIAGNOSIS — M79602 Pain in left arm: Secondary | ICD-10-CM | POA: Diagnosis not present

## 2015-09-18 DIAGNOSIS — M79622 Pain in left upper arm: Secondary | ICD-10-CM | POA: Diagnosis not present

## 2015-09-18 DIAGNOSIS — E119 Type 2 diabetes mellitus without complications: Secondary | ICD-10-CM | POA: Diagnosis not present

## 2015-09-18 DIAGNOSIS — I10 Essential (primary) hypertension: Secondary | ICD-10-CM | POA: Diagnosis not present

## 2015-09-23 ENCOUNTER — Ambulatory Visit: Payer: Self-pay | Admitting: Pharmacist

## 2015-09-23 ENCOUNTER — Other Ambulatory Visit: Payer: Self-pay | Admitting: Pharmacist

## 2015-09-23 NOTE — Patient Outreach (Addendum)
Called patient to reschedule Employee Sponsored Link to Wellness Diabetes Follow-up.  Was unable to reach patient and a voicemail was left for her to return my call.    Bennye Alm, PharmD [facility] PGY2 Pharmacy Resident [phone]

## 2015-09-23 NOTE — Patient Outreach (Signed)
Called patient and scheduled Link to Wellness followup appointment for next week.  Instructed patient to bring blood glucose meter and current medications to appointment.  Bennye Alm, PharmD [facility] PGY2 Pharmacy Resident [phone]

## 2015-09-30 ENCOUNTER — Other Ambulatory Visit: Payer: Self-pay | Admitting: Pharmacist

## 2015-09-30 ENCOUNTER — Encounter: Payer: Self-pay | Admitting: Pharmacist

## 2015-09-30 DIAGNOSIS — D0512 Intraductal carcinoma in situ of left breast: Secondary | ICD-10-CM | POA: Insufficient documentation

## 2015-09-30 NOTE — Progress Notes (Signed)
[place]  [phone] [phone]  ID: Carla Kim OB: August 25, 1950  MR#: [id number]  [id number]  Patient Care Team: Adin Hector, MD as PCP - General (Internal Medicine) Kem Parkinson, [facility] as [place] Management (Pharmacist)  CHIEF COMPLAINT: Left breast DCIS  INTERVAL HISTORY: Patient last evaluated in clinic in August 2016. She returns to clinic today for routine yearly evaluation. She continues to tolerate tamoxifen without significant side effects. She currently feels well and is asymptomatic. He has no neurologic complaints. She denies any recent fevers. She denies any chest pain or shortness of breath. She denies any nausea, vomiting, constipation, or diarrhea. She has no urinary complaints. Patient feels at her baseline and offers no specific complaints today.  REVIEW OF SYSTEMS:   Review of Systems  Constitutional: Negative.  Negative for fever, malaise/fatigue and weight loss.  Respiratory: Negative.  Negative for cough and shortness of breath.   Cardiovascular: Negative.  Negative for chest pain.  Gastrointestinal: Negative.  Negative for abdominal pain.  Genitourinary: Negative.   Musculoskeletal: Negative.   Neurological: Negative.  Negative for weakness.  Psychiatric/Behavioral: Negative.  The patient is not nervous/anxious.     As per HPI. Otherwise, a complete review of systems is negatve.  PAST MEDICAL HISTORY: Past Medical History:  Diagnosis Date  . Arthritis   . Barrett's esophagus   . 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 Colon polyps   . Condyloma acuminata   . COPD (chronic obstructive pulmonary disease) ([place])   . DM (diabetes mellitus) ([place])   . Endometrial polyp   . Gastroesophageal reflux disease   . Headache   . Hyperlipidemia   . Hypertension   . 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 Menopause   . Myalgia   . Shingles 10/2005  . Tobacco user   . Vaginal atrophy     PAST SURGICAL HISTORY: Past Surgical History:  Procedure Laterality Date  . BREAST BIOPSY Left 2014   Positive    FAMILY HISTORY Family History  Problem Relation Age of Onset  . Alcohol abuse Father   . Diabetes Father   . Heart disease Father   . Cancer Neg Hx   . Breast cancer Neg Hx        ADVANCED DIRECTIVES:    HEALTH MAINTENANCE: Social History  Substance Use Topics  . Smoking status: Former Smoker    Quit date: 02/02/2012  . Smokeless tobacco: Current User     Comment: vaping  . Alcohol use No     Comment: sporadic     Colonoscopy:  PAP:  Bone density:  Lipid panel:  Allergies  Allergen Reactions  . Lipitor [Atorvastatin] Other (See Comments)    Myalgia   . Statins Other (See Comments)    myalgia  . Varenicline Rash    Dyspepsia    Current Outpatient Prescriptions  Medication Sig Dispense Refill  . aspirin EC 81 MG tablet Take 81 mg by mouth daily.    . calcium-vitamin D (CALCIUM 500/D) 500-200 MG-UNIT per tablet Take by mouth. Reported on 03/29/2015    . lisinopril (PRINIVIL,ZESTRIL) 10 MG tablet Take 10 mg by mouth daily.    . metFORMIN (GLUCOPHAGE-XR) 500 MG 24 hr tablet Take 500 mg by mouth daily with breakfast.    . Multiple Vitamin (MULTI-VITAMINS) TABS Take by mouth. Reported on 03/29/2015    . Omega-3 Fatty Acids (  FISH OIL) 1000 MG CAPS Take by mouth.    Marland Kitchen omeprazole (PRILOSEC) 20 MG capsule Take 20 mg by mouth daily.    . tamoxifen (NOLVADEX) 20 MG tablet Take 1 tablet (20 mg total) by mouth daily. 30 tablet 11   No current facility-administered medications for this visit.     OBJECTIVE: There were no vitals filed for this visit.   There is no height or weight on file to calculate BMI.    ECOG FS:0 - Asymptomatic  General: Well-developed, well-nourished, no acute distress. Eyes: Pink conjunctiva, anicteric sclera. Breasts: Patient refused exam  today. Lungs: Clear to auscultation bilaterally. Heart: Regular rate and rhythm. No rubs, murmurs, or gallops. Abdomen: Soft, nontender, nondistended. No organomegaly noted, normoactive bowel sounds. Musculoskeletal: No edema, cyanosis, or clubbing. Neuro: Alert, answering all questions appropriately. Cranial nerves grossly intact. Skin: No rashes or petechiae noted. Psych: Normal affect.   LAB RESULTS:  Lab Results  Component Value Date   NA 143 11/12/2011   K 4.1 11/12/2011   CL 107 11/12/2011   CO2 29 11/12/2011   GLUCOSE 82 11/12/2011   BUN 10 11/12/2011   CREATININE 0.73 11/12/2011   CALCIUM 9.1 11/12/2011   PROT 7.2 04/14/2011   ALBUMIN 3.8 04/14/2011   AST 19 04/14/2011   ALT 25 04/14/2011   ALKPHOS 90 04/14/2011   BILITOT 0.5 04/14/2011   GFRNONAA >60 11/12/2011   GFRAA >60 11/12/2011    Lab Results  Component Value Date   WBC 4.5 01/27/2012   NEUTROABS 2.7 01/27/2012   HGB 14.3 01/27/2012   HCT 42.4 01/27/2012   MCV 86 01/27/2012   PLT 196 01/27/2012     STUDIES: US Venous Img Upper Uni Left  Result Date: 09/18/2015 CLINICAL DATA:  History of breast cancer, now with left upper extremity pain for the past 3 days. Evaluate for DVT. History of smoking. EXAM: LEFT UPPER EXTREMITY VENOUS DOPPLER ULTRASOUND TECHNIQUE: Gray-scale sonography with graded compression, as well as color Doppler and duplex ultrasound were performed to evaluate the upper extremity deep venous system from the level of the subclavian vein and including the jugular, axillary, basilic, radial, ulnar and upper cephalic vein. Spectral Doppler was utilized to evaluate flow at rest and with distal augmentation maneuvers. COMPARISON:  Chest CT - 10/16/2014 FINDINGS: Contralateral Subclavian Vein: Respiratory phasicity is normal and symmetric with the symptomatic side. No evidence of thrombus. Normal compressibility. Internal Jugular Vein: No evidence of thrombus. Normal compressibility, respiratory  phasicity and response to augmentation. Subclavian Vein: No evidence of thrombus. Normal compressibility, respiratory phasicity and response to augmentation. Axillary Vein: No evidence of thrombus. Normal compressibility, respiratory phasicity and response to augmentation. Cephalic Vein: No evidence of thrombus. Normal compressibility, respiratory phasicity and response to augmentation. Basilic Vein: No evidence of thrombus. Normal compressibility, respiratory phasicity and response to augmentation. Brachial Veins: No evidence of thrombus. Normal compressibility, respiratory phasicity and response to augmentation. Radial Veins: No evidence of thrombus. Normal compressibility, respiratory phasicity and response to augmentation. Ulnar Veins: No evidence of thrombus. Normal compressibility, respiratory phasicity and response to augmentation. Venous Reflux:  None visualized. Other Findings:  None visualized. IMPRESSION: No evidence DVT within the left upper extremity. Electronically Signed   By: Sandi Mariscal M.D.   On: 09/18/2015 13:34    ASSESSMENT: Left breast DCIS.  PLAN:    1.  Left breast DCIS: No evidence of disease.  Patient's most recent mammogram on June 07, 2015 was reported as BI-RADS 2.  Repeat in one year.  Continue tamoxifen for 5 years completing in January of 2019.  Patient continues to breast infrequent follow-up, therefore return to clinic in 1 year for further evaluation.   Patient expressed understanding and was in agreement with this plan. She also understands that She can call clinic at any time with any questions, concerns, or complaints.    Lloyd Huger, MD   09/30/2015 12:38 AM

## 2015-09-30 NOTE — Patient Outreach (Addendum)
Carla Kim) Care Management  Carla Kim   10/11/2015  [place] January 12, 1951 [id number]  Subjective: Patient presents today for 3 month diabetes follow-up as part of the employer-sponsored Link to Wellness program.  Current diabetes regimen includes metformin XR 500 mg daily.  Patient also continues on daily Aspirin, lisinopril but is not currently on a statin.  Most recent MD follow-up was 08/31/14.  Patient has a pending appt for 10/15/15.    Patient denies hypoglycemic events.  Patient reported dietary habits: Eats 1-2 meals/day.  Wants to eat own vegetables but garden did not produce very much this year.  Breakfast:Skips Lunch:Lobster Salad or Egg Salad Sandwich (whole wheat) Dinner: Vegetables (All except for corn or peas) or Order Out (Sub).  Last night butternut squash, asparagus, stuffing Snacks:Has been avoiding lately.  Occasional cookie Drinks:Bottled starbucks coffee drinks, sweet tea, hot tea, 5-6 teaspoon fulls of sugar with cup of coffee in the morning Patient states that she has been researching the DASH diet.   Patient reported exercise habits: No structured exercise regimen.  Only sits for 10 minutes during 8 hr shift. 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     Patient reports nocturia 2 times per night.  Has increased over the past 6 months.  Patient denies pain/burning upon urination.  Patient denies neuropathy. Patient denies visual changes. Patient reports self foot exams. Denies changes.   Home fasting CBG: reported 80-100 mg/dL (did not bring meter)  Objective:  Encounter Medications: Outpatient Encounter Prescriptions as of 09/30/2015  Medication Sig  . aspirin EC 81 MG tablet Take 81 mg by mouth daily.  Marland Kitchen lisinopril (PRINIVIL,ZESTRIL) 10 MG tablet Take 10 mg by mouth daily.  . metFORMIN (GLUCOPHAGE-XR) 500 MG 24 hr tablet Take 500 mg by mouth daily with breakfast.  . Multiple Vitamin  (MULTI-VITAMINS) TABS Take 1 tablet by mouth daily. Reported on 03/29/2015  . Omega-3 Fatty Acids (FISH OIL) 1000 MG CAPS Take by mouth.  Marland Kitchen omeprazole (PRILOSEC) 20 MG capsule Take 20 mg by mouth daily.  . tamoxifen (NOLVADEX) 20 MG tablet Take 1 tablet (20 mg total) by mouth daily.  . calcium-vitamin D (CALCIUM 500/D) 500-200 MG-UNIT per tablet Take by mouth. Reported on 03/29/2015  . metoprolol (LOPRESSOR) 100 MG tablet Take 100 mg by mouth 2 (two) times daily.   No facility-administered encounter medications on file as of 09/30/2015.     Functional Status: In your present state of health, do you have any difficulty performing the following activities: 09/30/2015 03/29/2015  Hearing? N N  Vision? N N  Difficulty concentrating or making decisions? N N  Walking or climbing stairs? N N  Dressing or bathing? N N  Doing errands, shopping? N N  Some recent data might be hidden    Fall/Depression Screening: PHQ 2/9 Scores 09/30/2015 03/29/2015 07/03/2014  PHQ - 2 Score 0 0 0    Assessment:  Diabetes: Most recent A1C was 6.8% which is at at goal of less than 7%. Patients LDL is 103 and she is not currently on a statin with previous intolerances to atorvastatin.     Plan/Goals for Next Visit: -Counseled on low carbohydrate diet, DASH diet, and carbohydrate content in common foods that she eats   -Start using Crystal Light Packets -Decrease sugar in coffee over the next 90 days by 1 teaspoonful over every 2 weeks.  -Will send Dr Caryl Comes a message to consider Rosuvastatin 10 mg weekly and titrate up to three  times weekly as tolerated or Ezetimibe   Next appointment to see me is: 3 months   Bennye Alm, PharmD [facility] PGY2 Pharmacy Resident [phone]  [facility] CM Care Plan Problem One   Flowsheet Row Most Recent Value  Care Plan Problem One  Blood pressure Control  Role Documenting the Problem One  Clinical Pharmacist  Care Plan for Problem One  Active  THN Long Term Goal (31-90 days)  Start  DASH diet and adhere to the DASH diet as evidenced by patient report over the next 90 days  THN Long Term Goal Start Date  09/30/15  Interventions for Problem One Long Term Goal  Counseled on low carbohydrate diet, DASH diet, and carbohydrate content in common foods that she eats    [facility] CM Care Plan Problem Two   Flowsheet Row Most Recent Value  Care Plan Problem Two  Diabetes Management  Role Documenting the Problem Two  Clinical Pharmacist  Care Plan for Problem Two  Active  THN Long Term Goal (31-90) days  Maintain A1C <7% as evidenced by A1C value over the next 90 days

## 2015-10-01 ENCOUNTER — Inpatient Hospital Stay: Payer: 59 | Attending: Oncology | Admitting: Oncology

## 2015-10-01 ENCOUNTER — Encounter (INDEPENDENT_AMBULATORY_CARE_PROVIDER_SITE_OTHER): Payer: Self-pay

## 2015-10-01 DIAGNOSIS — Z79899 Other long term (current) drug therapy: Secondary | ICD-10-CM | POA: Diagnosis not present

## 2015-10-01 DIAGNOSIS — E119 Type 2 diabetes mellitus without complications: Secondary | ICD-10-CM | POA: Diagnosis not present

## 2015-10-01 DIAGNOSIS — Z7984 Long term (current) use of oral hypoglycemic drugs: Secondary | ICD-10-CM | POA: Diagnosis not present

## 2015-10-01 DIAGNOSIS — N840 Polyp of corpus uteri: Secondary | ICD-10-CM | POA: Insufficient documentation

## 2015-10-01 DIAGNOSIS — A630 Anogenital (venereal) warts: Secondary | ICD-10-CM | POA: Insufficient documentation

## 2015-10-01 DIAGNOSIS — Z7982 Long term (current) use of aspirin: Secondary | ICD-10-CM | POA: Insufficient documentation

## 2015-10-01 DIAGNOSIS — E785 Hyperlipidemia, unspecified: Secondary | ICD-10-CM | POA: Diagnosis not present

## 2015-10-01 DIAGNOSIS — M129 Arthropathy, unspecified: Secondary | ICD-10-CM | POA: Diagnosis not present

## 2015-10-01 DIAGNOSIS — D0512 Intraductal carcinoma in situ of left breast: Secondary | ICD-10-CM | POA: Insufficient documentation

## 2015-10-01 DIAGNOSIS — Z87891 Personal history of nicotine dependence: Secondary | ICD-10-CM | POA: Diagnosis not present

## 2015-10-01 DIAGNOSIS — K219 Gastro-esophageal reflux disease without esophagitis: Secondary | ICD-10-CM | POA: Diagnosis not present

## 2015-10-01 DIAGNOSIS — N952 Postmenopausal atrophic vaginitis: Secondary | ICD-10-CM | POA: Diagnosis not present

## 2015-10-01 DIAGNOSIS — I10 Essential (primary) hypertension: Secondary | ICD-10-CM | POA: Insufficient documentation

## 2015-10-01 DIAGNOSIS — Z86010 Personal history of colonic polyps: Secondary | ICD-10-CM | POA: Insufficient documentation

## 2015-10-01 DIAGNOSIS — J449 Chronic obstructive pulmonary disease, unspecified: Secondary | ICD-10-CM | POA: Insufficient documentation

## 2015-10-01 DIAGNOSIS — Z8669 Personal history of other diseases of the nervous system and sense organs: Secondary | ICD-10-CM | POA: Insufficient documentation

## 2015-10-01 DIAGNOSIS — M797 Fibromyalgia: Secondary | ICD-10-CM | POA: Insufficient documentation

## 2015-10-01 DIAGNOSIS — Z79810 Long term (current) use of selective estrogen receptor modulators (SERMs): Secondary | ICD-10-CM | POA: Insufficient documentation

## 2015-10-15 ENCOUNTER — Other Ambulatory Visit: Payer: Self-pay | Admitting: Oncology

## 2015-10-16 DIAGNOSIS — E119 Type 2 diabetes mellitus without complications: Secondary | ICD-10-CM | POA: Diagnosis not present

## 2015-10-23 DIAGNOSIS — E784 Other hyperlipidemia: Secondary | ICD-10-CM | POA: Diagnosis not present

## 2015-10-23 DIAGNOSIS — I10 Essential (primary) hypertension: Secondary | ICD-10-CM | POA: Diagnosis not present

## 2015-10-23 DIAGNOSIS — D0512 Intraductal carcinoma in situ of left breast: Secondary | ICD-10-CM | POA: Diagnosis not present

## 2015-10-23 DIAGNOSIS — E119 Type 2 diabetes mellitus without complications: Secondary | ICD-10-CM | POA: Diagnosis not present

## 2015-11-11 DIAGNOSIS — Z780 Asymptomatic menopausal state: Secondary | ICD-10-CM | POA: Diagnosis not present

## 2015-11-11 DIAGNOSIS — M8588 Other specified disorders of bone density and structure, other site: Secondary | ICD-10-CM | POA: Diagnosis not present

## 2015-12-30 ENCOUNTER — Ambulatory Visit: Payer: 59

## 2016-01-08 ENCOUNTER — Other Ambulatory Visit: Payer: Self-pay | Admitting: Pharmacist

## 2016-01-08 NOTE — Patient Instructions (Signed)
DASH Eating Plan DASH stands for "Dietary Approaches to Stop Hypertension." The DASH eating plan is a healthy eating plan that has been shown to reduce high blood pressure (hypertension). Additional health benefits may include reducing the risk of type 2 diabetes mellitus, heart disease, and stroke. The DASH eating plan may also help with weight loss. What do I need to know about the DASH eating plan? For the DASH eating plan, you will follow these general guidelines:  Choose foods with less than 150 milligrams of sodium per serving (as listed on the food label).  Use salt-free seasonings or herbs instead of table salt or sea salt.  Check with your health care provider or pharmacist before using salt substitutes.  Eat lower-sodium products. These are often labeled as "low-sodium" or "no salt added."  Eat fresh foods. Avoid eating a lot of canned foods.  Eat more vegetables, fruits, and low-fat dairy products.  Choose whole grains. Look for the word "whole" as the first word in the ingredient list.  Choose fish and skinless chicken or turkey more often than red meat. Limit fish, poultry, and meat to 6 oz (170 g) each day.  Limit sweets, desserts, sugars, and sugary drinks.  Choose heart-healthy fats.  Eat more home-cooked food and less restaurant, buffet, and fast food.  Limit fried foods.  Do not fry foods. Cook foods using methods such as baking, boiling, grilling, and broiling instead.  When eating at a restaurant, ask that your food be prepared with less salt, or no salt if possible. What foods can I eat? Seek help from a dietitian for individual calorie needs. Grains  Whole grain or whole wheat bread. Brown rice. Whole grain or whole wheat pasta. Quinoa, bulgur, and whole grain cereals. Low-sodium cereals. Corn or whole wheat flour tortillas. Whole grain cornbread. Whole grain crackers. Low-sodium crackers. Vegetables  Fresh or frozen vegetables (raw, steamed, roasted, or  grilled). Low-sodium or reduced-sodium tomato and vegetable juices. Low-sodium or reduced-sodium tomato sauce and paste. Low-sodium or reduced-sodium canned vegetables. Fruits  All fresh, canned (in natural juice), or frozen fruits. Meat and Other Protein Products  Ground beef (85% or leaner), grass-fed beef, or beef trimmed of fat. Skinless chicken or turkey. Ground chicken or turkey. Pork trimmed of fat. All fish and seafood. Eggs. Dried beans, peas, or lentils. Unsalted nuts and seeds. Unsalted canned beans. Dairy  Low-fat dairy products, such as skim or 1% milk, 2% or reduced-fat cheeses, low-fat ricotta or cottage cheese, or plain low-fat yogurt. Low-sodium or reduced-sodium cheeses. Fats and Oils  Tub margarines without trans fats. Light or reduced-fat mayonnaise and salad dressings (reduced sodium). Avocado. Safflower, olive, or canola oils. Natural peanut or almond butter. Other  Unsalted popcorn and pretzels. The items listed above may not be a complete list of recommended foods or beverages. Contact your dietitian for more options.  What foods are not recommended? Grains  White bread. White pasta. White rice. Refined cornbread. Bagels and croissants. Crackers that contain trans fat. Vegetables  Creamed or fried vegetables. Vegetables in a cheese sauce. Regular canned vegetables. Regular canned tomato sauce and paste. Regular tomato and vegetable juices. Fruits  Canned fruit in light or heavy syrup. Fruit juice. Meat and Other Protein Products  Fatty cuts of meat. Ribs, chicken wings, bacon, sausage, bologna, salami, chitterlings, fatback, hot dogs, bratwurst, and packaged luncheon meats. Salted nuts and seeds. Canned beans with salt. Dairy  Whole or 2% milk, cream, half-and-half, and cream cheese. Whole-fat or sweetened yogurt. Full-fat cheeses   or blue cheese. Nondairy creamers and whipped toppings. Processed cheese, cheese spreads, or cheese curds. Condiments  Onion and garlic  salt, seasoned salt, table salt, and sea salt. Canned and packaged gravies. Worcestershire sauce. Tartar sauce. Barbecue sauce. Teriyaki sauce. Soy sauce, including reduced sodium. Steak sauce. Fish sauce. Oyster sauce. Cocktail sauce. Horseradish. Ketchup and mustard. Meat flavorings and tenderizers. Bouillon cubes. Hot sauce. Tabasco sauce. Marinades. Taco seasonings. Relishes. Fats and Oils  Butter, stick margarine, lard, shortening, ghee, and bacon fat. Coconut, palm kernel, or palm oils. Regular salad dressings. Other  Pickles and olives. Salted popcorn and pretzels. The items listed above may not be a complete list of foods and beverages to avoid. Contact your dietitian for more information.  Where can I find more information? National Heart, Lung, and Blood Institute: travelstabloid.com This information is not intended to replace advice given to you by your health care provider. Make sure you discuss any questions you have with your health care provider. Document Released: 01/15/2011 Document Revised: 07/04/2015 Document Reviewed: 11/30/2012 Elsevier Interactive Patient Education  2017 [employer].   Patient's preferred language:  English   ATTENTION:  If you do not speak English, language assistance services, free of charge, are available to you.    Nondiscrimination and Accessibility Statement: Discrimination is Against the [employer], a subsidiary of [employer], complies with [employer] civil rights laws and does not discriminate on the basis of race, color, national origin, age, disability, or sex.  [place] does not exclude people or treat them differently because of race, color, national origin, age, disability, or sex.  [place] Providers will:  . Provide free aids and services to people with disabilities to communicate effectively with [place], such as:     ? Qualified sign language  interpreters  ? Written information in other formats (large print, audio, accessible electronic formats, other formats)   . Provide free language services to people whose primary language is not [place], such as:    ? Qualified interpreters    ? Information written in other languages   If you need these services, contact your [facility] psychologist.  If you believe that a [employer] has failed to provide these services or discriminated in another way on the basis of race, color, national origin, age, disability, or sex, you can file a Tourist information centre manager with: [place], [phone] or http://chapman.info/.  You can file a grievance in person or by mail, fax, or email. If you need help filing a grievance, you may contact Valrie Hart, Interim Compliance Officer, [facility] Department of Compliance and Integrity, [place], [place], [place]. [place], [phone], [email or web address].    You can also file a civil rights complaint with the U.S. Department of Health and Financial controller, Office for [employer], electronically through the Office for Civil Rights Complaint Portal, available at OnSiteLending.nl.jsf, or by mail or phone at:  [place]. Department of Health and Human Services [address], [place] Room [phone], [facility] [place], [place]  [phone], [phone] (TDD) Complaint forms are available at CutFunds.si.

## 2016-01-10 ENCOUNTER — Encounter: Payer: Self-pay | Admitting: Pharmacist

## 2016-01-10 NOTE — Patient Outreach (Signed)
[facility]) Care Management  [place]   01/10/2016  Carla Kim 25-Jun-1950 [id number]  Subjective: Patient presents for a 3 month Link To Wellness diabetes visit. Current diabetes regimen includes metformin XR 500 mg daily. Patient is also on aspirin and lisinopril but not on a statin due to intolerance. Most recent MD follow-up appointment was 10/23/15.  Objective:  A1c = 6.8% 10/16/15 TC = 163 mg/dl; TG = 111 mg/dl; HDL = 56 mg/dl; LDL = 84 mg/dl 10/16/15  Encounter Medications: Outpatient Encounter Prescriptions as of 01/08/2016  Medication Sig  . aspirin EC 81 MG tablet Take 81 mg by mouth daily.  Marland Kitchen lisinopril (PRINIVIL,ZESTRIL) 10 MG tablet Take 10 mg by mouth daily.  . metFORMIN (GLUCOPHAGE-XR) 500 MG 24 hr tablet Take 500 mg by mouth at bedtime.   . metoprolol (LOPRESSOR) 100 MG tablet Take 50 mg by mouth 2 (two) times daily.   . Omega-3 Fatty Acids (FISH OIL) 1000 MG CAPS Take 1 capsule by mouth 2 (two) times daily.   Marland Kitchen omeprazole (PRILOSEC) 20 MG capsule Take 20 mg by mouth daily.  . tamoxifen (NOLVADEX) 20 MG tablet TAKE 1 TABLET BY MOUTH ONCE DAILY  . [DISCONTINUED] calcium-vitamin D (CALCIUM 500/D) 500-200 MG-UNIT per tablet Take by mouth. Reported on 03/29/2015  . [DISCONTINUED] Multiple Vitamin (MULTI-VITAMINS) TABS Take 1 tablet by mouth daily. Reported on 03/29/2015   No facility-administered encounter medications on file as of 01/08/2016.     Functional Status: In your present state of health, do you have any difficulty performing the following activities: 01/08/2016 09/30/2015  Hearing? N N  Vision? N N  Difficulty concentrating or making decisions? N N  Walking or climbing stairs? N N  Dressing or bathing? N N  Doing errands, shopping? N N  Some recent data might be hidden    Fall/Depression Screening: PHQ 2/9 Scores 01/08/2016 09/30/2015 03/29/2015 07/03/2014  PHQ - 2 Score 0 0 0 0    [facility]N CM Care Plan Problem One   Flowsheet  Row Most Recent Value  Care Plan Problem One  Blood pressure Control  Role Documenting the Problem One  Clinical Pharmacist  Care Plan for Problem One  Not Active  THN Long Term Goal (31-90 days)  Start DASH diet and adhere to the DASH diet as evidenced by patient report over the next 90 days  THN Long Term Goal Start Date  09/30/15  [facility] Long Term Goal Met Date  01/08/16 [Pt following DASH diet 50-75% of the time,  BP has decreased ]  Interventions for Problem One Long Term Goal  Counseled on low carbohydrate diet, DASH diet, and carbohydrate content in common foods that she eats    [facility] CM Care Plan Problem Two   Flowsheet Row Most Recent Value  Care Plan Problem Two  Diabetes Management  Role Documenting the Problem Two  Clinical Pharmacist  Care Plan for Problem Two  Active  Interventions for Problem Two Long Term Goal   Encouraged patient to make all medical appointments and continue to monitor BG  THN Long Term Goal (31-90) days  Maintain A1C <7% as evidenced by A1C value over the next 90 days  THN Long Term Goal Start Date  01/08/16  [facility] Long Term Goal Met Date  10/21/15     Assessment: 1. Diabetes: A1c within goal of less than 7%. Patient does not exercise due to activity at work. Not currently on a statin due to previous intolerance to atorvastatin. Lipid  panel within goal. Discussed the [facility] program, she will look into signing up for this program. 2. Diet: patient admits to skipping meals. She has been following the DASH diet. Occasionally she will eat oatmeal for breakfast/mid-morning and a banana. Mostly skips/misses lunch. Supper is balanced with protein, carbs and vegetables.  Plan: 1. Follow up phone call in 3 months and visit in 6 months with the pharmacist, unless active in the [facility] employer sponsored diabetes online program. 2. Encouraged patient to get a dental check-up.    K. Dicky Doe, PharmD [place] Management [phone]

## 2016-04-15 ENCOUNTER — Other Ambulatory Visit: Payer: Self-pay | Admitting: Pharmacist

## 2016-04-15 NOTE — Patient Outreach (Signed)
Called Carla Kim today for her 3 month Link To Wellness follow up. She inquired about the [facility] program back in October at the benefits fair, but has not been able to meet with anyone to get her enrolled due to her work schedule.  She has a smart phone, but she is not confident that the [employer] phone will work well with the [employer] program as her husband has issues connecting to the application. She would prefer to stay in the LTW program with the pharmacist every 6 months.  Last reported A1c was 6.8% on 10/16/15.   [facility] CM Care Plan Problem Two   Flowsheet Row Most Recent Value  Care Plan Problem Two  At risk of increased A1c  Role Documenting the Problem Two  Clinical Pharmacist  Care Plan for Problem Two  Not Active  Interventions for Problem Two Long Term Goal   Encouraged patient to make all medical appointments and continue to monitor BG  [facility] Long Term Goal (31-90) days  Maintain A1C <7% as evidenced by A1C value over the next 90 days  [facility]N Long Term Goal Start Date  01/08/16  [facility] Long Term Goal Met Date  10/16/15 [A1c 6.8% on 10/16/15]     Patient will follow up with the pharmacist 07/15/16 for her 6 month LTW diabetes visit.  [name] K. Dicky Doe, PharmD [place] [facility] [phone]

## 2016-06-08 ENCOUNTER — Ambulatory Visit
Admission: RE | Admit: 2016-06-08 | Discharge: 2016-06-08 | Disposition: A | Discharge status: Home / Self Care | Payer: 59 | Source: Ambulatory Visit | Attending: Oncology | Admitting: Oncology

## 2016-06-08 DIAGNOSIS — D0512 Intraductal carcinoma in situ of left breast: Secondary | ICD-10-CM | POA: Diagnosis present

## 2016-06-08 DIAGNOSIS — Z853 Personal history of malignant neoplasm of breast: Secondary | ICD-10-CM | POA: Insufficient documentation

## 2016-06-08 DIAGNOSIS — Z08 Encounter for follow-up examination after completed treatment for malignant neoplasm: Secondary | ICD-10-CM | POA: Diagnosis not present

## 2016-06-08 DIAGNOSIS — R928 Other abnormal and inconclusive findings on diagnostic imaging of breast: Secondary | ICD-10-CM | POA: Diagnosis not present

## 2016-07-15 ENCOUNTER — Other Ambulatory Visit: Payer: Self-pay | Admitting: Pharmacist

## 2016-07-15 ENCOUNTER — Encounter: Payer: Self-pay | Admitting: Pharmacist

## 2016-07-16 NOTE — Patient Outreach (Signed)
[facility]) Care Management  [place]   07/16/2016  Indiah Heyden 13-Jan-1951 [id number]  Subjective: Patient presents for a 6 month Link To Wellness diabetes visit. Current diabetes regimen includes metformin XR 500 mg daily. Patient is also on aspirin and lisinopril but not on a statin due to intolerance. Most recent MD follow-up appointment was 10/23/15.   Objective:  Vitals:   07/15/16 0938  BP: (!) 152/84  Weight: 122 lb (55.3 kg)  Height: 1.575 m (5\' 2" )   A1c = 6.8% 10/16/15 TC = 163 mg/dl; TG = 111 mg/dl; HDL = 56 mg/dl; LDL = 84 mg/dl 10/16/15  Encounter Medications: Outpatient Encounter Prescriptions as of 07/15/2016  Medication Sig  . aspirin EC 81 MG tablet Take 81 mg by mouth daily.  Marland Kitchen lisinopril (PRINIVIL,ZESTRIL) 10 MG tablet Take 10 mg by mouth daily.  . metFORMIN (GLUCOPHAGE-XR) 500 MG 24 hr tablet Take 500 mg by mouth at bedtime.   . metoprolol (LOPRESSOR) 100 MG tablet Take 50 mg by mouth 2 (two) times daily. Taking 50mg  once daily  . Omega-3 Fatty Acids (FISH OIL) 1000 MG CAPS Take 1 capsule by mouth 2 (two) times daily.   Marland Kitchen omeprazole (PRILOSEC) 20 MG capsule Take 20 mg by mouth daily.  . tamoxifen (NOLVADEX) 20 MG tablet TAKE 1 TABLET BY MOUTH ONCE DAILY   No facility-administered encounter medications on file as of 07/15/2016.     Functional Status: In your present state of health, do you have any difficulty performing the following activities: 07/15/2016 01/08/2016  Hearing? N N  Vision? N N  Difficulty concentrating or making decisions? N N  Walking or climbing stairs? N N  Dressing or bathing? N N  Doing errands, shopping? N N  Some recent data might be hidden    Fall/Depression Screening: Fall Risk  07/15/2016 01/08/2016 09/30/2015  Falls in the past year? No No No   PHQ 2/9 Scores 07/15/2016 01/08/2016 09/30/2015 03/29/2015 07/03/2014  PHQ - 2 Score 0 0 0 0 0    [facility] CM Care Plan Problem One     Most Recent Value  Care  Plan Problem One  Patient has not had an annual eye exam  Role Documenting the Problem One  [place] for Problem One  Active  [facility]N Long Term Goal   Patient will make an appointment for an annual diabetic eye exam over the next 90 days as evidenced by patient report  [facility] Long Term Goal Start Date  07/15/16  Interventions for Problem One Long Term Goal  Encouraged patient to make an eye exam    [facility] CM Care Plan Problem Two     Most Recent Value  Care Plan Problem Two  At risk of increased A1c  Role Documenting the Problem Two  Clinical Pharmacist  Care Plan for Problem Two  Active  Interventions for Problem Two Long Term Goal   Encouraged patient to make all medical appointments and continue to monitor BG  THN Long Term Goal  Maintain A1C <7% as evidenced by A1C value over the next 90 days  THN Long Term Goal Start Date  07/15/16      Assessment: 1. Diabetes: A1c within goal of less than 7%. Patient does not exercise due to activity at work. She is on aspirin and and ACE inhibitor. Not currently on a statin due to previous intolerance to atorvastatin. Lipid panel within goal. She was not able to sign up for the  [place] program this year. 2. Diet: patient admits to skipping meals. Occasionally she will eat oatmeal for breakfast/mid-morning and a banana. Typically eats a salad at lunch. Supper is balanced with protein, carbs and vegetables. 3. Blood Pressure: Not within goal of less than 140/90 mm HG. Previously prescribed metoprolol 100 mg twice daily. Patient states she was told to split the tablet in half to equal 50 mg twice daily; only taking 50 mg daily.  Plan: 1. Follow up phone call in 3 months and visit in 6 months with the pharmacist. 2. Encouraged patient to get a dental check-up. 3. Annual exam scheduled for October 2018. 4. Pharmacist will contact Dr. Caryl Comes about metoprolol dose discrepancy.      Eivan Gallina K. Dicky Doe, PharmD [place]  [facility] [phone]

## 2016-09-29 NOTE — Progress Notes (Signed)
[place]  [phone] [phone]  ID: Carla Kim OB: 1951/01/19  MR#: [id number]  KVQ#:[id number]  Patient Care Team: Adin Hector, MD as PCP - General (Internal Medicine) Cammy Copa, [facility] as Pharmacist (Pharmacist)  CHIEF COMPLAINT: Left breast DCIS  INTERVAL HISTORY: Patient last evaluated in clinic in August 2017. She returns to clinic today for routine yearly evaluation. She continues to tolerate tamoxifen without significant side effects. She currently feels well and is asymptomatic. She has no neurologic complaints. She denies any recent fevers. She denies any chest pain or shortness of breath. She denies any nausea, vomiting, constipation, or diarrhea. She has no urinary complaints. Patient feels at her baseline and offers no specific complaints today.  REVIEW OF SYSTEMS:   Review of Systems  Constitutional: Negative.  Negative for fever, malaise/fatigue and weight loss.  Respiratory: Negative.  Negative for cough and shortness of breath.   Cardiovascular: Negative.  Negative for chest pain and leg swelling.  Gastrointestinal: Negative.  Negative for abdominal pain.  Genitourinary: Negative.   Musculoskeletal: Negative.   Skin: Negative.  Negative for rash.  Neurological: Negative.  Negative for weakness.  Psychiatric/Behavioral: Negative.  The patient is not nervous/anxious.     As per HPI. Otherwise, a complete review of systems is negative.  PAST MEDICAL HISTORY: Past Medical History:  Diagnosis Date  . Arthritis   . Barrett's esophagus   . 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 Colon polyps   . Condyloma acuminata   . COPD (chronic obstructive pulmonary disease) ([place])   . DM (diabetes mellitus) ([place])   . Endometrial polyp   . Gastroesophageal reflux disease   . Headache   . Hyperlipidemia   . Hypertension   . 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 Menopause   . Myalgia   . Shingles 10/2005  . Tobacco user   . Vaginal atrophy     PAST SURGICAL HISTORY: Past Surgical History:  Procedure Laterality Date  . BREAST BIOPSY Left 2013   Positive  . BREAST BIOPSY Left 1968   benign    FAMILY HISTORY Family History  Problem Relation Age of Onset  . Alcohol abuse Father   . Diabetes Father   . Heart disease Father   . Cancer Neg Hx   . Breast cancer Neg Hx        ADVANCED DIRECTIVES:    HEALTH MAINTENANCE: Social History  Substance Use Topics  . Smoking status: Former Smoker    Quit date: 02/02/2012  . Smokeless tobacco: Current User     Comment: vaping  . Alcohol use No     Comment: sporadic     Colonoscopy:  PAP:  Bone density:  Lipid panel:  Allergies  Allergen Reactions  . Lipitor [Atorvastatin] Other (See Comments)    Myalgia   . Statins Other (See Comments)    myalgia  . Varenicline Other (See Comments)    Dyspepsia    Current Outpatient Prescriptions  Medication Sig Dispense Refill  . acetaminophen (TYLENOL) 500 MG tablet Take 500 mg by mouth every 6 (six) hours as needed.    Marland Kitchen aspirin EC 81 MG tablet Take 81 mg by mouth daily.    Marland Kitchen lisinopril (PRINIVIL,ZESTRIL) 10 MG tablet Take 10 mg by mouth daily.    . metFORMIN (GLUCOPHAGE-XR) 500 MG 24 hr tablet Take 500 mg by mouth at bedtime.     Marland Kitchen  metoprolol (LOPRESSOR) 100 MG tablet Take 50 mg by mouth daily. Taking 50mg  once daily    . Omega-3 Fatty Acids (FISH OIL) 1000 MG CAPS Take 1 capsule by mouth 2 (two) times daily.     Marland Kitchen omeprazole (PRILOSEC) 20 MG capsule Take 20 mg by mouth daily.    . tamoxifen (NOLVADEX) 20 MG tablet TAKE 1 TABLET BY MOUTH ONCE DAILY 30 tablet 11   No current facility-administered medications for this visit.     OBJECTIVE: Vitals:   09/30/16 1036  BP: (!) 154/101  Pulse: 73  Resp: 18  Temp: 98.1 F (36.7 C)     Body mass index is 21.66 kg/m.    ECOG FS:0 -  Asymptomatic  General: Well-developed, well-nourished, no acute distress. Eyes: Pink conjunctiva, anicteric sclera. Breasts: Breast exam performed today. Well healed surgical scar on left upper outer quadrant of left breast. Mild tenderness at surgical scar. Indistinct tissue thickening along inframammary fold. Unchanged per patient. No suspicious masses, skin, nipple, or lymphadenopathy changes noted.   Lungs: Clear to auscultation bilaterally. Heart: Regular rate and rhythm. No rubs, murmurs, or gallops. Abdomen: Soft, nontender, nondistended. No organomegaly noted, normoactive bowel sounds. Musculoskeletal: No edema, cyanosis, or clubbing. Neuro: Alert, answering all questions appropriately. Cranial nerves grossly intact. Skin: No rashes or petechiae noted. Psych: Normal affect.   LAB RESULTS:  Lab Results  Component Value Date   NA 143 11/12/2011   K 4.1 11/12/2011   CL 107 11/12/2011   CO2 29 11/12/2011   GLUCOSE 82 11/12/2011   BUN 10 11/12/2011   CREATININE 0.73 11/12/2011   CALCIUM 9.1 11/12/2011   PROT 7.2 04/14/2011   ALBUMIN 3.8 04/14/2011   AST 19 04/14/2011   ALT 25 04/14/2011   ALKPHOS 90 04/14/2011   BILITOT 0.5 04/14/2011   GFRNONAA >60 11/12/2011   GFRAA >60 11/12/2011    Lab Results  Component Value Date   WBC 4.5 01/27/2012   NEUTROABS 2.7 01/27/2012   HGB 14.3 01/27/2012   HCT 42.4 01/27/2012   MCV 86 01/27/2012   PLT 196 01/27/2012     STUDIES: No results found.  ASSESSMENT: Left breast DCIS.  PLAN:    1.  Left breast DCIS: No evidence of disease.  Patient's most recent mammogram on June 08, 2016 was reported as BI-RADS 2.  Repeat bilateral diagnostic mammogram in one year. Skin thickening noted along inframammary fold likely due to radiation therapy. Patient states is unchanged. Will continue tamoxifen for 5 years completing in January of 2019.  Patient performs self breast exams and encouraged to continue. Will plan for her to return to  clinic in 1 year for further evaluation and potentially discharge from clinic at that time.   Patient expressed understanding and was in agreement with this plan. She also understands that She can call clinic at any time with any questions, concerns, or complaints.   Tynika Luddy G. Zenia Resides, NP 09/30/16 1109 AM  Patient was seen and evaluated independently and I agree with the assessment and plan as dictated above. Approximately 20 minutes was spent in discussion of which greater than 50% was consultation.  Lloyd Huger, MD 10/02/16 9:54 AM

## 2016-09-30 ENCOUNTER — Inpatient Hospital Stay: Payer: 59 | Attending: Oncology | Admitting: Oncology

## 2016-09-30 VITALS — BP 154/101 | HR 73 | Temp 98.1°F | Resp 18 | Wt 118.4 lb

## 2016-09-30 DIAGNOSIS — Z79899 Other long term (current) drug therapy: Secondary | ICD-10-CM | POA: Insufficient documentation

## 2016-09-30 DIAGNOSIS — I10 Essential (primary) hypertension: Secondary | ICD-10-CM | POA: Diagnosis not present

## 2016-09-30 DIAGNOSIS — Z79810 Long term (current) use of selective estrogen receptor modulators (SERMs): Secondary | ICD-10-CM | POA: Diagnosis not present

## 2016-09-30 DIAGNOSIS — Z87891 Personal history of nicotine dependence: Secondary | ICD-10-CM | POA: Diagnosis not present

## 2016-09-30 DIAGNOSIS — E785 Hyperlipidemia, unspecified: Secondary | ICD-10-CM | POA: Insufficient documentation

## 2016-09-30 DIAGNOSIS — Z170 Estrogen receptor positive status [ER+]: Secondary | ICD-10-CM | POA: Diagnosis not present

## 2016-09-30 DIAGNOSIS — Z86010 Personal history of colonic polyps: Secondary | ICD-10-CM | POA: Insufficient documentation

## 2016-09-30 DIAGNOSIS — D0512 Intraductal carcinoma in situ of left breast: Secondary | ICD-10-CM | POA: Diagnosis not present

## 2016-09-30 DIAGNOSIS — J449 Chronic obstructive pulmonary disease, unspecified: Secondary | ICD-10-CM | POA: Diagnosis not present

## 2016-09-30 DIAGNOSIS — E119 Type 2 diabetes mellitus without complications: Secondary | ICD-10-CM | POA: Insufficient documentation

## 2016-09-30 DIAGNOSIS — Z7984 Long term (current) use of oral hypoglycemic drugs: Secondary | ICD-10-CM | POA: Insufficient documentation

## 2016-09-30 DIAGNOSIS — Z7982 Long term (current) use of aspirin: Secondary | ICD-10-CM | POA: Insufficient documentation

## 2016-09-30 DIAGNOSIS — K219 Gastro-esophageal reflux disease without esophagitis: Secondary | ICD-10-CM | POA: Diagnosis not present

## 2016-09-30 NOTE — Progress Notes (Signed)
Patient is here for follow up, she is doing well no complaints.  

## 2016-10-23 DIAGNOSIS — J439 Emphysema, unspecified: Secondary | ICD-10-CM | POA: Diagnosis not present

## 2016-10-23 DIAGNOSIS — Z0000 Encounter for general adult medical examination without abnormal findings: Secondary | ICD-10-CM | POA: Diagnosis not present

## 2016-10-23 DIAGNOSIS — E784 Other hyperlipidemia: Secondary | ICD-10-CM | POA: Diagnosis not present

## 2016-10-23 DIAGNOSIS — I10 Essential (primary) hypertension: Secondary | ICD-10-CM | POA: Diagnosis not present

## 2016-10-23 DIAGNOSIS — E119 Type 2 diabetes mellitus without complications: Secondary | ICD-10-CM | POA: Diagnosis not present

## 2016-11-23 DIAGNOSIS — E119 Type 2 diabetes mellitus without complications: Secondary | ICD-10-CM | POA: Diagnosis not present

## 2016-11-23 DIAGNOSIS — M8589 Other specified disorders of bone density and structure, multiple sites: Secondary | ICD-10-CM | POA: Diagnosis not present

## 2016-11-23 DIAGNOSIS — I10 Essential (primary) hypertension: Secondary | ICD-10-CM | POA: Diagnosis not present

## 2016-11-23 DIAGNOSIS — J439 Emphysema, unspecified: Secondary | ICD-10-CM | POA: Diagnosis not present

## 2016-12-24 DIAGNOSIS — K2270 Barrett's esophagus without dysplasia: Secondary | ICD-10-CM | POA: Diagnosis not present

## 2016-12-24 DIAGNOSIS — Z86010 Personal history of colonic polyps: Secondary | ICD-10-CM | POA: Diagnosis not present

## 2016-12-29 ENCOUNTER — Encounter: Payer: Self-pay | Admitting: *Deleted

## 2016-12-29 ENCOUNTER — Ambulatory Visit
Admission: RE | Admit: 2016-12-29 | Discharge: 2016-12-29 | Disposition: A | Discharge status: Home / Self Care | Payer: 59 | Source: Ambulatory Visit | Attending: Gastroenterology | Admitting: Gastroenterology

## 2016-12-29 ENCOUNTER — Ambulatory Visit: Payer: 59 | Admitting: Anesthesiology

## 2016-12-29 ENCOUNTER — Encounter
Admission: RE | Disposition: A | Discharge status: Home / Self Care | Payer: Self-pay | Source: Ambulatory Visit | Attending: Gastroenterology

## 2016-12-29 DIAGNOSIS — K644 Residual hemorrhoidal skin tags: Secondary | ICD-10-CM | POA: Diagnosis not present

## 2016-12-29 DIAGNOSIS — Z79899 Other long term (current) drug therapy: Secondary | ICD-10-CM | POA: Diagnosis not present

## 2016-12-29 DIAGNOSIS — D123 Benign neoplasm of transverse colon: Secondary | ICD-10-CM | POA: Diagnosis not present

## 2016-12-29 DIAGNOSIS — Z7984 Long term (current) use of oral hypoglycemic drugs: Secondary | ICD-10-CM | POA: Diagnosis not present

## 2016-12-29 DIAGNOSIS — E119 Type 2 diabetes mellitus without complications: Secondary | ICD-10-CM | POA: Diagnosis not present

## 2016-12-29 DIAGNOSIS — K2950 Unspecified chronic gastritis without bleeding: Secondary | ICD-10-CM | POA: Diagnosis not present

## 2016-12-29 DIAGNOSIS — K5790 Diverticulosis of intestine, part unspecified, without perforation or abscess without bleeding: Secondary | ICD-10-CM | POA: Diagnosis not present

## 2016-12-29 DIAGNOSIS — E785 Hyperlipidemia, unspecified: Secondary | ICD-10-CM | POA: Diagnosis not present

## 2016-12-29 DIAGNOSIS — Z87891 Personal history of nicotine dependence: Secondary | ICD-10-CM | POA: Diagnosis not present

## 2016-12-29 DIAGNOSIS — K641 Second degree hemorrhoids: Secondary | ICD-10-CM | POA: Insufficient documentation

## 2016-12-29 DIAGNOSIS — K2270 Barrett's esophagus without dysplasia: Secondary | ICD-10-CM | POA: Insufficient documentation

## 2016-12-29 DIAGNOSIS — J449 Chronic obstructive pulmonary disease, unspecified: Secondary | ICD-10-CM | POA: Insufficient documentation

## 2016-12-29 DIAGNOSIS — Z888 Allergy status to other drugs, medicaments and biological substances status: Secondary | ICD-10-CM | POA: Diagnosis not present

## 2016-12-29 DIAGNOSIS — I10 Essential (primary) hypertension: Secondary | ICD-10-CM | POA: Diagnosis not present

## 2016-12-29 DIAGNOSIS — Z7982 Long term (current) use of aspirin: Secondary | ICD-10-CM | POA: Diagnosis not present

## 2016-12-29 DIAGNOSIS — M1990 Unspecified osteoarthritis, unspecified site: Secondary | ICD-10-CM | POA: Insufficient documentation

## 2016-12-29 DIAGNOSIS — Z1211 Encounter for screening for malignant neoplasm of colon: Secondary | ICD-10-CM | POA: Diagnosis not present

## 2016-12-29 DIAGNOSIS — K635 Polyp of colon: Secondary | ICD-10-CM | POA: Diagnosis not present

## 2016-12-29 DIAGNOSIS — Z853 Personal history of malignant neoplasm of breast: Secondary | ICD-10-CM | POA: Diagnosis not present

## 2016-12-29 DIAGNOSIS — Z86010 Personal history of colonic polyps: Secondary | ICD-10-CM | POA: Insufficient documentation

## 2016-12-29 DIAGNOSIS — K5730 Diverticulosis of large intestine without perforation or abscess without bleeding: Secondary | ICD-10-CM | POA: Diagnosis not present

## 2016-12-29 DIAGNOSIS — K219 Gastro-esophageal reflux disease without esophagitis: Secondary | ICD-10-CM | POA: Diagnosis not present

## 2016-12-29 DIAGNOSIS — K449 Diaphragmatic hernia without obstruction or gangrene: Secondary | ICD-10-CM | POA: Insufficient documentation

## 2016-12-29 HISTORY — DX: Angina pectoris, unspecified: I20.9

## 2016-12-29 HISTORY — PX: COLONOSCOPY WITH PROPOFOL: SHX5780

## 2016-12-29 HISTORY — PX: ESOPHAGOGASTRODUODENOSCOPY: SHX5428

## 2016-12-29 HISTORY — DX: Personal history of colonic polyps: Z86.010

## 2016-12-29 HISTORY — DX: Polyp of corpus uteri: N84.0

## 2016-12-29 SURGERY — EGD (ESOPHAGOGASTRODUODENOSCOPY)
Anesthesia: General

## 2016-12-29 MED ORDER — PROPOFOL 10 MG/ML IV BOLUS
INTRAVENOUS | Status: DC | PRN
  Administered 2016-12-29: 50 mg via INTRAVENOUS

## 2016-12-29 MED ORDER — MIDAZOLAM HCL 2 MG/2ML IJ SOLN
INTRAMUSCULAR | Status: AC
  Filled 2016-12-29: qty 2

## 2016-12-29 MED ORDER — SODIUM CHLORIDE 0.9 % IV SOLN: INTRAVENOUS | Status: DC

## 2016-12-29 MED ORDER — SODIUM CHLORIDE 0.9 % IV SOLN
INTRAVENOUS | Status: DC
  Administered 2016-12-29: 1000 mL via INTRAVENOUS
  Administered 2016-12-29: 14:00:00 via INTRAVENOUS

## 2016-12-29 MED ORDER — PROPOFOL 500 MG/50ML IV EMUL
INTRAVENOUS | Status: AC
  Filled 2016-12-29: qty 50

## 2016-12-29 MED ORDER — MIDAZOLAM HCL 2 MG/2ML IJ SOLN
INTRAMUSCULAR | Status: DC | PRN
  Administered 2016-12-29: 2 mg via INTRAVENOUS

## 2016-12-29 MED ORDER — LIDOCAINE HCL (PF) 2 % IJ SOLN
INTRAMUSCULAR | Status: AC
  Filled 2016-12-29: qty 10

## 2016-12-29 MED ORDER — PROPOFOL 500 MG/50ML IV EMUL
INTRAVENOUS | Status: DC | PRN
  Administered 2016-12-29: 150 ug/kg/min via INTRAVENOUS

## 2016-12-29 MED ORDER — LIDOCAINE HCL (CARDIAC) 20 MG/ML IV SOLN
INTRAVENOUS | Status: DC | PRN
  Administered 2016-12-29: 60 mg via INTRAVENOUS

## 2016-12-29 NOTE — Transfer of Care (Signed)
Immediate Anesthesia Transfer of Care Note  Patient: Carla Kim  Procedure(s) Performed: ESOPHAGOGASTRODUODENOSCOPY (EGD) (N/A ) COLONOSCOPY WITH PROPOFOL (N/A )  Patient Location: Endoscopy Unit  Anesthesia Type:General  Level of Consciousness: drowsy and patient cooperative  Airway & Oxygen Therapy: Patient Spontanous Breathing and Patient connected to nasal cannula oxygen  Post-op Assessment: Report given to RN and Post -op Vital signs reviewed and stable  Post vital signs: Reviewed and stable  Last Vitals:  Vitals:   12/29/16 1320  BP: (!) 168/58  Pulse: 87  Resp: 20  Temp: (!) 35.4 C  SpO2: 99%    Last Pain:  Vitals:   12/29/16 1320  TempSrc: Tympanic         Complications: No apparent anesthesia complications

## 2016-12-29 NOTE — Op Note (Signed)
[facility] Gastroenterology Patient Name: Catheline Hixon Procedure Date: 12/29/2016 2:10 PM MRN: [id number] Account #: [id number] Date of Birth: [birth year] Admit Type: Outpatient Age: 66 Room: [facility] ENDO ROOM 3 Gender: Female Note Status: Finalized Procedure:            Colonoscopy Indications:          Personal history of colonic polyps Providers:            Lollie Sails, MD Referring MD:         Ramonita Lab, MD (Referring MD) Medicines:            Monitored Anesthesia Care Complications:        No immediate complications. Procedure:            Pre-Anesthesia Assessment:                       - ASA Grade Assessment: III - A patient with severe                        systemic disease.                       After obtaining informed consent, the colonoscope was                        passed under direct vision. Throughout the procedure,                        the patient's blood pressure, pulse, and oxygen                        saturations were monitored continuously. The                        Colonoscope was introduced through the anus and                        advanced to the the cecum, identified by appendiceal                        orifice and ileocecal valve. The colonoscopy was                        performed without difficulty. The patient tolerated the                        procedure well. The quality of the bowel preparation                        was good. Findings:      A few small-mouthed diverticula were found in the sigmoid colon and       distal descending colon.      A 2 mm polyp was found in the splenic flexure. The polyp was sessile.       The polyp was removed with a cold biopsy forceps. Resection and       retrieval were complete.      Non-bleeding external and internal hemorrhoids were found during       perianal exam, during digital exam and during anoscopy. The hemorrhoids       were small and Grade II (internal  hemorrhoids  that prolapse but reduce       spontaneously). Impression:           - Diverticulosis in the sigmoid colon and in the distal                        descending colon.                       - One 2 mm polyp at the splenic flexure, removed with a                        cold biopsy forceps. Resected and retrieved.                       - Non-bleeding external and internal hemorrhoids. Recommendation:       - Await pathology results.                       - Telephone GI clinic for pathology results in 1 week. Procedure Code(s):    --- Professional ---                       [phone], Colonoscopy, flexible; with biopsy, single or                        multiple Diagnosis Code(s):    --- Professional ---                       K64.1, Second degree hemorrhoids                       D12.3, Benign neoplasm of transverse colon (hepatic                        flexure or splenic flexure)                       Z86.010, Personal history of colonic polyps                       K57.30, Diverticulosis of large intestine without                        perforation or abscess without bleeding CPT copyright 2016 American Medical Association. All rights reserved. The codes documented in this report are preliminary and upon coder review may  be revised to meet current compliance requirements. Lollie Sails, MD 12/29/2016 2:54:00 PM This report has been signed electronically. Number of Addenda: 0 Note Initiated On: 12/29/2016 2:10 PM Scope Withdrawal Time: 0 hours 9 minutes 51 seconds  Total Procedure Duration: 0 hours 18 minutes 9 seconds       [facility]

## 2016-12-29 NOTE — Op Note (Signed)
[facility] Gastroenterology Patient Name: Carla Kim Procedure Date: 12/29/2016 2:11 PM MRN: [id number] Account #: [id number] Date of Birth: [birth year] Admit Type: Outpatient Age: 66 Room: [facility] ENDO ROOM 3 Gender: Female Note Status: Finalized Procedure:            Upper GI endoscopy Indications:          Follow-up of Barrett's esophagus Providers:            Lollie Sails, MD Referring MD:         Ramonita Lab, MD (Referring MD) Medicines:            Monitored Anesthesia Care Complications:        No immediate complications. Procedure:            Pre-Anesthesia Assessment:                       - ASA Grade Assessment: III - A patient with severe                        systemic disease.                       After obtaining informed consent, the endoscope was                        passed under direct vision. Throughout the procedure,                        the patient's blood pressure, pulse, and oxygen                        saturations were monitored continuously. The Endoscope                        was introduced through the mouth, and advanced to the                        third part of duodenum. The upper GI endoscopy was                        accomplished without difficulty. The patient tolerated                        the procedure well. Findings:      The Z-line was variable and was found 37 cm from the incisors.      There were esophageal mucosal changes suggestive of short-segment       Barrett's esophagus present at the gastroesophageal junction. The       maximum longitudinal extent of these mucosal changes was 1 cm in length.       Mucosa was biopsied with a cold forceps for histology in 4 quadrants at       37 cm from the incisors. One specimen bottle was sent to pathology.      The exam of the esophagus was otherwise normal.      A small hiatal hernia was found. The Z-line was a variable distance from       incisors; the hiatal  hernia was sliding.      The entire examined stomach was normal.      The cardia and gastric fundus were normal on  retroflexion.      The examined duodenum was normal. Impression:           - Z-line variable, 37 cm from the incisors.                       - Esophageal mucosal changes suggestive of                        short-segment Barrett's esophagus. Biopsied.                       - Small hiatal hernia.                       - Normal stomach.                       - Normal examined duodenum. Recommendation:       - Await pathology results.                       - Continue present medications.                       - Telephone GI clinic for pathology results in 1 week. Procedure Code(s):    --- Professional ---                       [phone], Esophagogastroduodenoscopy, flexible, transoral;                        with biopsy, single or multiple Diagnosis Code(s):    --- Professional ---                       K22.8, Other specified diseases of esophagus                       K22.70, Barrett's esophagus without dysplasia                       K44.9, Diaphragmatic hernia without obstruction or                        gangrene CPT copyright 2016 American Medical Association. All rights reserved. The codes documented in this report are preliminary and upon coder review may  be revised to meet current compliance requirements. Lollie Sails, MD 12/29/2016 2:28:19 PM This report has been signed electronically. Number of Addenda: 0 Note Initiated On: 12/29/2016 2:11 PM      [facility]

## 2016-12-29 NOTE — Anesthesia Post-op Follow-up Note (Signed)
Anesthesia QCDR form completed.        

## 2016-12-29 NOTE — H&P (Signed)
Outpatient short stay form Pre-procedure 12/29/2016 2:03 PM Carla Sails MD  Primary Physician: Dr. Ramonita Lab  Reason for visit:  EGD and colonoscopy  History of present illness:  Patient is a 66 year old female presenting today as above. She has a personal history of Barrett's esophagus and adenomatous colon polyps. Ending today for follow-up evaluations. She does take 81 mg aspirin daily that is been held for a couple days. She takes no other aspirin products or blood thinning agents.    Current Facility-Administered Medications:  .  0.9 %  sodium chloride infusion, , Intravenous, Continuous, Carla Sails, MD, Last Rate: 20 mL/hr at 12/29/16 1337, 1,000 mL at 12/29/16 1337 .  0.9 %  sodium chloride infusion, , Intravenous, Continuous, Carla Sails, MD  Medications Prior to Admission  Medication Sig Dispense Refill Last Dose  . acetaminophen (TYLENOL) 500 MG tablet Take 500 mg by mouth every 6 (six) hours as needed.   Past Week at Unknown time  . aspirin EC 81 MG tablet Take 81 mg by mouth daily.   Past Week at Unknown time  . calcium-vitamin D (OSCAL WITH D) 500-200 MG-UNIT tablet Take 1 tablet daily with breakfast by mouth.   Past Week at Unknown time  . lisinopril (PRINIVIL,ZESTRIL) 10 MG tablet Take 10 mg by mouth daily.   12/28/2016 at Unknown time  . lisinopril (PRINIVIL,ZESTRIL) 20 MG tablet Take 20 mg daily by mouth.   Past Week at Unknown time  . metFORMIN (GLUCOPHAGE-XR) 500 MG 24 hr tablet Take 500 mg by mouth at bedtime.    Past Week at Unknown time  . metoprolol (LOPRESSOR) 100 MG tablet Take 50 mg 2 (two) times daily by mouth. Taking 50mg  once daily   12/28/2016 at Unknown time  . Omega-3 Fatty Acids (FISH OIL) 1000 MG CAPS Take 1 capsule by mouth 2 (two) times daily.    Past Week at Unknown time  . omeprazole (PRILOSEC) 20 MG capsule Take 20 mg by mouth daily.   Past Week at Unknown time  . tamoxifen (NOLVADEX) 20 MG tablet TAKE 1 TABLET BY MOUTH ONCE  DAILY (Patient not taking: Reported on 12/29/2016) 30 tablet 11 Not Taking at Unknown time     Allergies  Allergen Reactions  . Lipitor [Atorvastatin] Other (See Comments)    Myalgia   . Statins Other (See Comments)    myalgia  . Varenicline Other (See Comments)    Dyspepsia     Past Medical History:  Diagnosis Date  . Anginal pain ([place])   . Arthritis   . Barrett's esophagus   . 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 Colon polyps   . Condyloma acuminata   . COPD (chronic obstructive pulmonary disease) ([place])   . DM (diabetes mellitus) ([place])   . Endometrial polyp   . Gastroesophageal reflux disease   . Headache   . History of colonic polyps   . Hyperlipidemia   . Hypertension   . 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 Menopause   . Myalgia   . Shingles 10/2005  . Tobacco user   . Uterine polyp   . Vaginal atrophy     Review of systems:      Physical Exam    Heart and lungs: Regular rate and rhythm without rub or gallop, lungs are bilaterally clear.    HEENT: Normocephalic atraumatic eyes are anicteric    Other:  Pertinant exam for procedure: Soft nontender nondistended bowel sounds positive normoactive.    Planned proceedures: EGD, colonoscopy and indicated procedures. I have discussed the risks benefits and complications of procedures to include not limited to bleeding, infection, perforation and the risk of sedation and the patient wishes to proceed.    Carla Sails, MD Gastroenterology 12/29/2016  2:03 PM

## 2016-12-29 NOTE — Anesthesia Preprocedure Evaluation (Addendum)
Anesthesia Evaluation  Patient identified by MRN, date of birth, ID band  Reviewed: Allergy & Precautions, H&P , NPO status , Patient's Chart, lab work & pertinent test results, reviewed documented beta blocker date and time   Airway Mallampati: II   Neck ROM: full    Dental  (+) Poor Dentition   Pulmonary neg pulmonary ROS, COPD,  COPD inhaler, former smoker,    Pulmonary exam normal        Cardiovascular Exercise Tolerance: Poor hypertension, On Medications + angina with exertion negative cardio ROS Normal cardiovascular exam Rhythm:regular Rate:Normal     Neuro/Psych  Headaches, negative neurological ROS  negative psych ROS   GI/Hepatic negative GI ROS, Neg liver ROS, GERD  ,  Endo/Other  negative endocrine ROSdiabetes  Renal/GU negative Renal ROS  negative genitourinary   Musculoskeletal   Abdominal   Peds  Hematology negative hematology ROS (+)   Anesthesia Other Findings Past Medical History: No date: Anginal pain (HCC) No date: Arthritis No date: Barrett's esophagus 08/2011: Breast cancer ([place])     Comment:  in milk duct- s/p lumpectomy, lumptectomy, radiation               completed 02/2012 No date: Colon polyps No date: Condyloma acuminata No date: COPD (chronic obstructive pulmonary disease) ([place]) No date: DM (diabetes mellitus) ([place]) No date: Endometrial polyp No date: Gastroesophageal reflux disease No date: Headache No date: History of colonic polyps No date: Hyperlipidemia No date: Hypertension 04/2011: LGSIL (low grade squamous intraepithelial dysplasia) No date: Menopause No date: Myalgia 10/2005: Shingles No date: Tobacco user No date: Uterine polyp No date: Vaginal atrophy Past Surgical History: No date: APPENDECTOMY 2013: BREAST BIOPSY; Left     Comment:  Positive 1968: BREAST BIOPSY; Left     Comment:  benign No date: COLONOSCOPY No date: ESOPHAGOGASTRODUODENOSCOPY No date:  MASTECTOMY PARTIAL / LUMPECTOMY; Left No date: TONSILLECTOMY BMI    Body Mass Index:  21.95 kg/m     Reproductive/Obstetrics negative OB ROS                            Anesthesia Physical Anesthesia Plan  ASA: III  Anesthesia Plan: General   Post-op Pain Management:    Induction:   PONV Risk Score and Plan:   Airway Management Planned:   Additional Equipment:   Intra-op Plan:   Post-operative Plan:   Informed Consent: I have reviewed the patients History and Physical, chart, labs and discussed the procedure including the risks, benefits and alternatives for the proposed anesthesia with the patient or authorized representative who has indicated his/her understanding and acceptance.   Dental Advisory Given  Plan Discussed with: CRNA  Anesthesia Plan Comments:        Anesthesia Quick Evaluation

## 2016-12-30 ENCOUNTER — Encounter: Payer: Self-pay | Admitting: Gastroenterology

## 2016-12-30 NOTE — Anesthesia Postprocedure Evaluation (Signed)
Anesthesia Post Note  Patient: Carla Kim  Procedure(s) Performed: ESOPHAGOGASTRODUODENOSCOPY (EGD) (N/A ) COLONOSCOPY WITH PROPOFOL (N/A )  Patient location during evaluation: PACU Anesthesia Type: General Level of consciousness: awake and alert Pain management: pain level controlled Vital Signs Assessment: post-procedure vital signs reviewed and stable Respiratory status: spontaneous breathing, nonlabored ventilation, respiratory function stable and patient connected to nasal cannula oxygen Cardiovascular status: blood pressure returned to baseline and stable Postop Assessment: no apparent nausea or vomiting Anesthetic complications: no     Last Vitals:  Vitals:   12/29/16 1515 12/29/16 1525  BP: 109/80 (!) 118/96  Pulse: 83 76  Resp: (!) 21 17  Temp:    SpO2: 96% 100%    Last Pain:  Vitals:   12/29/16 1455  TempSrc: Tympanic                 Molli Barrows

## 2017-01-02 LAB — SURGICAL PATHOLOGY

## 2017-01-13 ENCOUNTER — Other Ambulatory Visit: Payer: Self-pay | Admitting: Pharmacist

## 2017-01-13 NOTE — Patient Outreach (Signed)
Spoke with Carla Kim yesterday regarding her diabetes (6 month checkup).  I informed her that the [facility] To Well[facility] program will be ending 02-08-17 and offered to update her information via phone for this visit. I explained the two options available starting January 1st, 2019; if she has a Biomedical engineer phone, she will qualify for the [facility] program.   She saw her primary care provider 11/23/16. Her last A1c was 7.3% in September. TC = 174 mg/dl; TG = 119 mg/dl; HDL = 51.5 mg/dl; LDL = 99 mg/dl (10/23/16).   [facility] CM Care Plan Problem One     Most Recent Value  Care Plan Problem One  Patient has not had an annual eye exam  Role Documenting the Problem One  [place] for Problem One  Active  [facility] Long Term Goal   Patient will make an appointment for an annual diabetic eye exam over the next 90 days as evidenced by patient report  [facility] Long Term Goal Start Date  07/15/16  Inter[facility] for Problem One Long Term Goal  Encouraged patient to make an eye exam    [facility] CM Care Plan Problem Two     Most Recent Value  Care Plan Problem Two  At risk of increased A1c  Role Documenting the Problem Two  Clinical Pharmacist  Care Plan for Problem Two  Active  Interventions for Problem Two Long Term Goal   Encouraged patient to make all medical appointments and continue to monitor BG  [facility] Long Term Goal  Maintain A1C <7% as evidenced by A1C value over the next 90 days  [facility] Long Term Goal Start Date  07/15/16  [facility] Long Term Goal Met Date  -- [Goal not met]     Prior to Admission medications   Medication Sig Start Date End Date Taking? Authorizing Provider  aspirin EC 81 MG tablet Take 81 mg by mouth daily.   Yes [provider]  lisinopril (PRINIVIL,ZESTRIL) 20 MG tablet Take 20 mg daily by mouth.   Yes [provider]  metFORMIN (GLUCOPHAGE-XR) 500 MG 24 hr tablet Take 500 mg by mouth at bedtime.    Yes [provider]  metoprolol (LOPRESSOR) 100 MG tablet  Take 100 mg by mouth 2 (two) times daily. Taking 47m once daily   Yes [provider]  Omega-3 Fatty Acids (FISH OIL) 1000 MG CAPS Take 1 capsule by mouth 2 (two) times daily.    Yes [provider]  omeprazole (PRILOSEC) 20 MG capsule Take 20 mg by mouth daily.   Yes [provider]  acetaminophen (TYLENOL) 500 MG tablet Take 500 mg by mouth every 6 (six) hours as needed.    [provider]  calcium-vitamin D (OSCAL WITH D) 500-200 MG-UNIT tablet Take 1 tablet daily with breakfast by mouth.    [provider]   KCleopatra Cedar HDicky Doe PharmD TLindenManagement [id number]

## 2017-01-20 ENCOUNTER — Ambulatory Visit: Payer: 59 | Admitting: Pharmacist

## 2017-05-14 DIAGNOSIS — J209 Acute bronchitis, unspecified: Secondary | ICD-10-CM | POA: Diagnosis not present

## 2017-05-18 DIAGNOSIS — J439 Emphysema, unspecified: Secondary | ICD-10-CM | POA: Diagnosis not present

## 2017-05-18 DIAGNOSIS — Z86000 Personal history of in-situ neoplasm of breast: Secondary | ICD-10-CM | POA: Diagnosis not present

## 2017-05-18 DIAGNOSIS — E119 Type 2 diabetes mellitus without complications: Secondary | ICD-10-CM | POA: Diagnosis not present

## 2017-05-18 DIAGNOSIS — E7849 Other hyperlipidemia: Secondary | ICD-10-CM | POA: Diagnosis not present

## 2017-05-21 DIAGNOSIS — R197 Diarrhea, unspecified: Secondary | ICD-10-CM | POA: Diagnosis not present

## 2017-05-25 DIAGNOSIS — J439 Emphysema, unspecified: Secondary | ICD-10-CM | POA: Diagnosis not present

## 2017-05-25 DIAGNOSIS — I10 Essential (primary) hypertension: Secondary | ICD-10-CM | POA: Diagnosis not present

## 2017-05-25 DIAGNOSIS — E119 Type 2 diabetes mellitus without complications: Secondary | ICD-10-CM | POA: Diagnosis not present

## 2017-05-25 DIAGNOSIS — M8589 Other specified disorders of bone density and structure, multiple sites: Secondary | ICD-10-CM | POA: Diagnosis not present

## 2017-06-09 ENCOUNTER — Ambulatory Visit
Admission: RE | Admit: 2017-06-09 | Discharge: 2017-06-09 | Disposition: A | Discharge status: Home / Self Care | Payer: 59 | Source: Ambulatory Visit | Attending: Nurse Practitioner | Admitting: Nurse Practitioner

## 2017-06-09 DIAGNOSIS — D0512 Intraductal carcinoma in situ of left breast: Secondary | ICD-10-CM

## 2017-06-09 DIAGNOSIS — R922 Inconclusive mammogram: Secondary | ICD-10-CM | POA: Diagnosis not present

## 2017-06-09 HISTORY — DX: Personal history of irradiation: Z92.3

## 2017-09-24 DIAGNOSIS — E119 Type 2 diabetes mellitus without complications: Secondary | ICD-10-CM | POA: Diagnosis not present

## 2017-09-28 NOTE — Progress Notes (Deleted)
Carla Kim  [phone] [phone]  ID: Norlene Duel OB: [birth year]  MR#: [id number]  HYW#:[id number]  Patient Care Team: Adin Hector, MD as PCP - General (Internal Medicine) Cammy Copa, [facility] as Pharmacist (Pharmacist)  CHIEF COMPLAINT: Left breast DCIS  INTERVAL HISTORY: Patient last evaluated in clinic in August 2016. She returns to clinic today for routine yearly evaluation. She continues to tolerate tamoxifen without significant side effects. She currently feels well and is asymptomatic. He has no neurologic complaints. She denies any recent fevers. She denies any chest pain or shortness of breath. She denies any nausea, vomiting, constipation, or diarrhea. She has no urinary complaints. Patient feels at her baseline and offers no specific complaints today.  REVIEW OF SYSTEMS:   Review of Systems  Constitutional: Negative.  Negative for fever, malaise/fatigue and weight loss.  Respiratory: Negative.  Negative for cough and shortness of breath.   Cardiovascular: Negative.  Negative for chest pain.  Gastrointestinal: Negative.  Negative for abdominal pain.  Genitourinary: Negative.   Musculoskeletal: Negative.   Neurological: Negative.  Negative for weakness.  Psychiatric/Behavioral: Negative.  The patient is not nervous/anxious.     As per HPI. Otherwise, a complete review of systems is negatve.  PAST MEDICAL HISTORY: Past Medical History:  Diagnosis Date  . Anginal pain ([place])   . Arthritis   . Barrett's esophagus   . Breast cancer ([place]) 08/2011   in milk duct- s/p lumpectomy, lumptectomy, radiation completed 02/2012  . Colon polyps   . Condyloma acuminata   . COPD (chronic obstructive pulmonary disease) ([place])   . DM (diabetes mellitus) ([place])   . Endometrial polyp   . Gastroesophageal reflux disease   . Headache   . History of colonic polyps   . Hyperlipidemia   . Hypertension   . 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 Menopause   . Myalgia   . Personal history of radiation therapy 2013   left breast  . Shingles 10/2005  . Tobacco user   . Uterine polyp   . Vaginal atrophy     PAST SURGICAL HISTORY: Past Surgical History:  Procedure Laterality Date  . APPENDECTOMY    . BREAST BIOPSY Left 2013   DUCTAL CARCINOMA IN SITU   . BREAST BIOPSY Left 1968   benign  . BREAST LUMPECTOMY Left 11/19/2011   DCIS, clear margins  . COLONOSCOPY    . COLONOSCOPY WITH PROPOFOL N/A 12/29/2016   Procedure: COLONOSCOPY WITH PROPOFOL;  Surgeon: Lollie Sails, MD;  Location: [facility] ENDOSCOPY;  Service: Endoscopy;  Laterality: N/A;  . ESOPHAGOGASTRODUODENOSCOPY    . ESOPHAGOGASTRODUODENOSCOPY N/A 12/29/2016   Procedure: ESOPHAGOGASTRODUODENOSCOPY (EGD);  Surgeon: Lollie Sails, MD;  Location: [facility] ENDOSCOPY;  Service: Endoscopy;  Laterality: N/A;  . TONSILLECTOMY      FAMILY HISTORY Family History  Problem Relation Age of Onset  . Alcohol abuse Father   . Diabetes Father   . Heart disease Father   . Cancer Neg Hx   . Breast cancer Neg Hx        ADVANCED DIRECTIVES:    HEALTH MAINTENANCE: Social History   Tobacco Use  . Smoking status: Former Smoker    Last attempt to quit: 02/02/2012    Years since quitting: 5.6  . Smokeless tobacco: Never Used  . Tobacco comment: vaping  Substance Use Topics  . Alcohol use: Yes    Alcohol/week: 0.0 standard drinks    Comment: sporadic  .  Drug use: No     Colonoscopy:  PAP:  Bone density:  Lipid panel:  Allergies  Allergen Reactions  . Lipitor [Atorvastatin] Other (See Comments)    Myalgia   . Statins Other (See Comments)    myalgia  . Varenicline Other (See Comments)    Dyspepsia    Current Outpatient Medications  Medication Sig Dispense Refill  . acetaminophen (TYLENOL) 500 MG tablet Take 500 mg by mouth every 6 (six) hours as needed.    Marland Kitchen aspirin EC 81 MG tablet Take 81 mg by mouth daily.    .  calcium-vitamin D (OSCAL WITH D) 500-200 MG-UNIT tablet Take 1 tablet daily with breakfast by mouth.    Marland Kitchen lisinopril (PRINIVIL,ZESTRIL) 20 MG tablet Take 20 mg daily by mouth.    . metFORMIN (GLUCOPHAGE-XR) 500 MG 24 hr tablet Take 500 mg by mouth at bedtime.     . metoprolol (LOPRESSOR) 100 MG tablet Take 100 mg by mouth 2 (two) times daily. Taking 50mg  once daily    . Omega-3 Fatty Acids (FISH OIL) 1000 MG CAPS Take 1 capsule by mouth 2 (two) times daily.     Marland Kitchen omeprazole (PRILOSEC) 20 MG capsule Take 20 mg by mouth daily.     No current facility-administered medications for this visit.     OBJECTIVE: There were no vitals filed for this visit.   There is no height or weight on file to calculate BMI.    ECOG FS:0 - Asymptomatic  General: Well-developed, well-nourished, no acute distress. Eyes: Pink conjunctiva, anicteric sclera. Breasts: Patient refused exam today. Lungs: Clear to auscultation bilaterally. Heart: Regular rate and rhythm. No rubs, murmurs, or gallops. Abdomen: Soft, nontender, nondistended. No organomegaly noted, normoactive bowel sounds. Musculoskeletal: No edema, cyanosis, or clubbing. Neuro: Alert, answering all questions appropriately. Cranial nerves grossly intact. Skin: No rashes or petechiae noted. Psych: Normal affect.   LAB RESULTS:  Lab Results  Component Value Date   NA 143 11/12/2011   K 4.1 11/12/2011   CL 107 11/12/2011   CO2 29 11/12/2011   GLUCOSE 82 11/12/2011   BUN 10 11/12/2011   CREATININE 0.73 11/12/2011   CALCIUM 9.1 11/12/2011   PROT 7.2 04/14/2011   ALBUMIN 3.8 04/14/2011   AST 19 04/14/2011   ALT 25 04/14/2011   ALKPHOS 90 04/14/2011   BILITOT 0.5 04/14/2011   GFRNONAA >60 11/12/2011   GFRAA >60 11/12/2011    Lab Results  Component Value Date   WBC 4.5 01/27/2012   NEUTROABS 2.7 01/27/2012   HGB 14.3 01/27/2012   HCT 42.4 01/27/2012   MCV 86 01/27/2012   PLT 196 01/27/2012     STUDIES: No results  found.  ASSESSMENT: Left breast DCIS.  PLAN:    1.  Left breast DCIS: No evidence of disease.  Patient's most recent mammogram on June 07, 2015 was reported as BI-RADS 2.  Repeat in one year.  Continue tamoxifen for 5 years completing in January of 2019.  Patient continues to breast infrequent follow-up, therefore return to clinic in 1 year for further evaluation.   Patient expressed understanding and was in agreement with this plan. She also understands that She can call clinic at any time with any questions, concerns, or complaints.    Lloyd Huger, MD   09/28/2017 10:44 PM

## 2017-09-29 ENCOUNTER — Inpatient Hospital Stay: Payer: 59 | Admitting: Oncology

## 2017-09-29 ENCOUNTER — Encounter: Payer: Self-pay | Admitting: Oncology

## 2017-10-01 DIAGNOSIS — I10 Essential (primary) hypertension: Secondary | ICD-10-CM | POA: Diagnosis not present

## 2017-10-01 DIAGNOSIS — E119 Type 2 diabetes mellitus without complications: Secondary | ICD-10-CM | POA: Diagnosis not present

## 2017-10-01 DIAGNOSIS — J439 Emphysema, unspecified: Secondary | ICD-10-CM | POA: Diagnosis not present

## 2017-10-01 DIAGNOSIS — M8589 Other specified disorders of bone density and structure, multiple sites: Secondary | ICD-10-CM | POA: Diagnosis not present

## 2017-10-04 ENCOUNTER — Other Ambulatory Visit: Payer: Self-pay | Admitting: Internal Medicine

## 2017-10-04 DIAGNOSIS — R569 Unspecified convulsions: Secondary | ICD-10-CM

## 2017-10-04 DIAGNOSIS — R55 Syncope and collapse: Secondary | ICD-10-CM

## 2017-10-21 ENCOUNTER — Ambulatory Visit
Admission: RE | Admit: 2017-10-21 | Discharge: 2017-10-21 | Disposition: A | Discharge status: Home / Self Care | Payer: 59 | Source: Ambulatory Visit | Attending: Internal Medicine | Admitting: Internal Medicine

## 2017-10-21 DIAGNOSIS — R55 Syncope and collapse: Secondary | ICD-10-CM | POA: Insufficient documentation

## 2017-10-21 DIAGNOSIS — I6529 Occlusion and stenosis of unspecified carotid artery: Secondary | ICD-10-CM | POA: Insufficient documentation

## 2017-10-21 DIAGNOSIS — R51 Headache: Secondary | ICD-10-CM | POA: Diagnosis not present

## 2017-10-21 DIAGNOSIS — R569 Unspecified convulsions: Secondary | ICD-10-CM | POA: Diagnosis not present

## 2018-02-14 DIAGNOSIS — J439 Emphysema, unspecified: Secondary | ICD-10-CM | POA: Diagnosis not present

## 2018-02-14 DIAGNOSIS — E119 Type 2 diabetes mellitus without complications: Secondary | ICD-10-CM | POA: Diagnosis not present

## 2018-02-14 DIAGNOSIS — E7849 Other hyperlipidemia: Secondary | ICD-10-CM | POA: Diagnosis not present

## 2018-02-14 DIAGNOSIS — I10 Essential (primary) hypertension: Secondary | ICD-10-CM | POA: Diagnosis not present

## 2018-02-21 DIAGNOSIS — J439 Emphysema, unspecified: Secondary | ICD-10-CM | POA: Diagnosis not present

## 2018-02-21 DIAGNOSIS — I10 Essential (primary) hypertension: Secondary | ICD-10-CM | POA: Diagnosis not present

## 2018-02-21 DIAGNOSIS — Z0000 Encounter for general adult medical examination without abnormal findings: Secondary | ICD-10-CM | POA: Diagnosis not present

## 2018-02-21 DIAGNOSIS — E1159 Type 2 diabetes mellitus with other circulatory complications: Secondary | ICD-10-CM | POA: Diagnosis not present

## 2018-06-13 DIAGNOSIS — E1159 Type 2 diabetes mellitus with other circulatory complications: Secondary | ICD-10-CM | POA: Diagnosis not present

## 2018-06-13 DIAGNOSIS — E7849 Other hyperlipidemia: Secondary | ICD-10-CM | POA: Diagnosis not present

## 2018-06-15 DIAGNOSIS — E7849 Other hyperlipidemia: Secondary | ICD-10-CM | POA: Diagnosis not present

## 2018-06-15 DIAGNOSIS — E1159 Type 2 diabetes mellitus with other circulatory complications: Secondary | ICD-10-CM | POA: Diagnosis not present

## 2018-06-27 DIAGNOSIS — E1159 Type 2 diabetes mellitus with other circulatory complications: Secondary | ICD-10-CM | POA: Diagnosis not present

## 2018-06-27 DIAGNOSIS — I10 Essential (primary) hypertension: Secondary | ICD-10-CM | POA: Diagnosis not present

## 2018-06-27 DIAGNOSIS — M8589 Other specified disorders of bone density and structure, multiple sites: Secondary | ICD-10-CM | POA: Diagnosis not present

## 2018-06-27 DIAGNOSIS — J439 Emphysema, unspecified: Secondary | ICD-10-CM | POA: Diagnosis not present

## 2018-08-15 ENCOUNTER — Other Ambulatory Visit: Payer: Self-pay | Admitting: Internal Medicine

## 2018-08-15 DIAGNOSIS — Z1231 Encounter for screening mammogram for malignant neoplasm of breast: Secondary | ICD-10-CM

## 2018-09-19 ENCOUNTER — Ambulatory Visit
Admission: RE | Admit: 2018-09-19 | Discharge: 2018-09-19 | Disposition: A | Discharge status: Home / Self Care | Payer: 59 | Source: Ambulatory Visit | Attending: Internal Medicine | Admitting: Internal Medicine

## 2018-09-19 DIAGNOSIS — Z1231 Encounter for screening mammogram for malignant neoplasm of breast: Secondary | ICD-10-CM | POA: Insufficient documentation

## 2019-04-21 ENCOUNTER — Other Ambulatory Visit: Payer: Self-pay | Admitting: Internal Medicine

## 2019-06-07 DIAGNOSIS — J439 Emphysema, unspecified: Secondary | ICD-10-CM | POA: Diagnosis not present

## 2019-06-07 DIAGNOSIS — E1159 Type 2 diabetes mellitus with other circulatory complications: Secondary | ICD-10-CM | POA: Diagnosis not present

## 2019-06-07 DIAGNOSIS — E7849 Other hyperlipidemia: Secondary | ICD-10-CM | POA: Diagnosis not present

## 2019-06-07 DIAGNOSIS — Z86000 Personal history of in-situ neoplasm of breast: Secondary | ICD-10-CM | POA: Diagnosis not present

## 2019-06-13 DIAGNOSIS — E1159 Type 2 diabetes mellitus with other circulatory complications: Secondary | ICD-10-CM | POA: Diagnosis not present

## 2019-06-13 DIAGNOSIS — J439 Emphysema, unspecified: Secondary | ICD-10-CM | POA: Diagnosis not present

## 2019-06-13 DIAGNOSIS — I10 Essential (primary) hypertension: Secondary | ICD-10-CM | POA: Diagnosis not present

## 2019-06-13 DIAGNOSIS — Z0000 Encounter for general adult medical examination without abnormal findings: Secondary | ICD-10-CM | POA: Diagnosis not present

## 2019-09-21 ENCOUNTER — Other Ambulatory Visit: Payer: Self-pay | Admitting: Internal Medicine

## 2019-09-21 DIAGNOSIS — Z1231 Encounter for screening mammogram for malignant neoplasm of breast: Secondary | ICD-10-CM

## 2019-09-27 DIAGNOSIS — E1159 Type 2 diabetes mellitus with other circulatory complications: Secondary | ICD-10-CM | POA: Diagnosis not present

## 2019-10-18 ENCOUNTER — Ambulatory Visit
Admission: RE | Admit: 2019-10-18 | Discharge: 2019-10-18 | Disposition: A | Discharge status: Home / Self Care | Payer: 59 | Source: Ambulatory Visit | Attending: Internal Medicine | Admitting: Internal Medicine

## 2019-10-18 ENCOUNTER — Other Ambulatory Visit: Payer: Self-pay

## 2019-10-18 DIAGNOSIS — Z1231 Encounter for screening mammogram for malignant neoplasm of breast: Secondary | ICD-10-CM | POA: Diagnosis not present

## 2019-10-24 ENCOUNTER — Other Ambulatory Visit: Payer: Self-pay | Admitting: Internal Medicine

## 2019-10-24 DIAGNOSIS — N6489 Other specified disorders of breast: Secondary | ICD-10-CM

## 2019-10-24 DIAGNOSIS — R928 Other abnormal and inconclusive findings on diagnostic imaging of breast: Secondary | ICD-10-CM

## 2019-10-30 ENCOUNTER — Ambulatory Visit
Admission: RE | Admit: 2019-10-30 | Discharge: 2019-10-30 | Disposition: A | Discharge status: Home / Self Care | Payer: 59 | Source: Ambulatory Visit | Attending: Internal Medicine | Admitting: Internal Medicine

## 2019-10-30 ENCOUNTER — Other Ambulatory Visit: Payer: Self-pay

## 2019-10-30 DIAGNOSIS — N6489 Other specified disorders of breast: Secondary | ICD-10-CM | POA: Diagnosis not present

## 2019-10-30 DIAGNOSIS — R928 Other abnormal and inconclusive findings on diagnostic imaging of breast: Secondary | ICD-10-CM

## 2019-11-11 ENCOUNTER — Other Ambulatory Visit: Payer: Self-pay | Admitting: Family Medicine

## 2019-11-11 DIAGNOSIS — R6889 Other general symptoms and signs: Secondary | ICD-10-CM | POA: Diagnosis not present

## 2019-11-11 DIAGNOSIS — J019 Acute sinusitis, unspecified: Secondary | ICD-10-CM | POA: Diagnosis not present

## 2019-11-11 DIAGNOSIS — Z03818 Encounter for observation for suspected exposure to other biological agents ruled out: Secondary | ICD-10-CM | POA: Diagnosis not present

## 2019-11-11 DIAGNOSIS — J209 Acute bronchitis, unspecified: Secondary | ICD-10-CM | POA: Diagnosis not present

## 2019-11-29 DIAGNOSIS — J439 Emphysema, unspecified: Secondary | ICD-10-CM | POA: Diagnosis not present

## 2019-11-29 DIAGNOSIS — E1159 Type 2 diabetes mellitus with other circulatory complications: Secondary | ICD-10-CM | POA: Diagnosis not present

## 2019-11-29 DIAGNOSIS — Z23 Encounter for immunization: Secondary | ICD-10-CM | POA: Diagnosis not present

## 2019-11-29 DIAGNOSIS — E7849 Other hyperlipidemia: Secondary | ICD-10-CM | POA: Diagnosis not present

## 2019-11-29 DIAGNOSIS — I10 Essential (primary) hypertension: Secondary | ICD-10-CM | POA: Diagnosis not present

## 2020-01-03 DIAGNOSIS — R399 Unspecified symptoms and signs involving the genitourinary system: Secondary | ICD-10-CM | POA: Diagnosis not present

## 2020-01-26 ENCOUNTER — Other Ambulatory Visit: Payer: Self-pay | Admitting: Internal Medicine

## 2020-04-26 ENCOUNTER — Other Ambulatory Visit: Payer: Self-pay | Admitting: Internal Medicine

## 2020-04-26 DIAGNOSIS — Z03818 Encounter for observation for suspected exposure to other biological agents ruled out: Secondary | ICD-10-CM | POA: Diagnosis not present

## 2020-04-26 DIAGNOSIS — Z20822 Contact with and (suspected) exposure to covid-19: Secondary | ICD-10-CM | POA: Diagnosis not present

## 2020-05-31 DIAGNOSIS — E7849 Other hyperlipidemia: Secondary | ICD-10-CM | POA: Diagnosis not present

## 2020-05-31 DIAGNOSIS — E1159 Type 2 diabetes mellitus with other circulatory complications: Secondary | ICD-10-CM | POA: Diagnosis not present

## 2020-05-31 DIAGNOSIS — J439 Emphysema, unspecified: Secondary | ICD-10-CM | POA: Diagnosis not present

## 2020-05-31 DIAGNOSIS — I10 Essential (primary) hypertension: Secondary | ICD-10-CM | POA: Diagnosis not present

## 2020-06-05 ENCOUNTER — Other Ambulatory Visit: Payer: Self-pay

## 2020-06-05 DIAGNOSIS — J439 Emphysema, unspecified: Secondary | ICD-10-CM | POA: Diagnosis not present

## 2020-06-05 DIAGNOSIS — E1159 Type 2 diabetes mellitus with other circulatory complications: Secondary | ICD-10-CM | POA: Diagnosis not present

## 2020-06-05 DIAGNOSIS — Z0000 Encounter for general adult medical examination without abnormal findings: Secondary | ICD-10-CM | POA: Diagnosis not present

## 2020-06-05 DIAGNOSIS — I10 Essential (primary) hypertension: Secondary | ICD-10-CM | POA: Diagnosis not present

## 2020-06-05 MED ORDER — METFORMIN HCL ER 500 MG PO TB24
ORAL_TABLET | ORAL | 11 refills | Status: AC
  Filled 2020-06-05: qty 120, 30d supply, fill #0

## 2020-06-18 ENCOUNTER — Other Ambulatory Visit: Payer: Self-pay

## 2020-07-22 ENCOUNTER — Other Ambulatory Visit: Payer: Self-pay

## 2020-07-22 MED ORDER — OMEPRAZOLE 20 MG PO CPDR
DELAYED_RELEASE_CAPSULE | ORAL | 1 refills | Status: AC
  Filled 2020-07-22: qty 90, 90d supply, fill #0

## 2020-07-24 ENCOUNTER — Other Ambulatory Visit: Payer: Self-pay

## 2020-07-25 ENCOUNTER — Other Ambulatory Visit: Payer: Self-pay

## 2021-09-29 ENCOUNTER — Other Ambulatory Visit: Payer: Self-pay | Admitting: Internal Medicine

## 2021-09-29 DIAGNOSIS — Z1231 Encounter for screening mammogram for malignant neoplasm of breast: Secondary | ICD-10-CM

## 2021-10-16 ENCOUNTER — Ambulatory Visit
Admission: RE | Admit: 2021-10-16 | Discharge: 2021-10-16 | Disposition: A | Discharge status: Home / Self Care | Payer: Medicare Other | Source: Ambulatory Visit | Attending: Internal Medicine | Admitting: Internal Medicine

## 2021-10-16 DIAGNOSIS — Z1231 Encounter for screening mammogram for malignant neoplasm of breast: Secondary | ICD-10-CM | POA: Insufficient documentation

## 2022-08-13 IMAGING — MG DIGITAL SCREENING BILAT W/ TOMO W/ CAD
8 series · 9 of 24 positions shown · non-contrast
Comparison: Previous exam(s).

CLINICAL DATA: Screening.

EXAM:
DIGITAL SCREENING BILATERAL MAMMOGRAM WITH TOMO AND CAD

[L CC synth-2D]
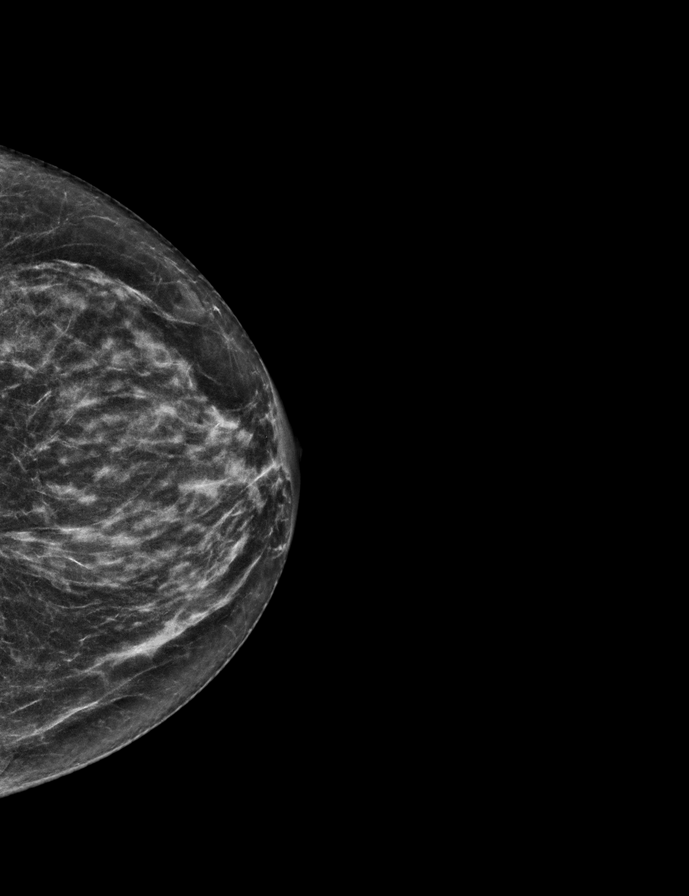

[R MLO synth-2D]
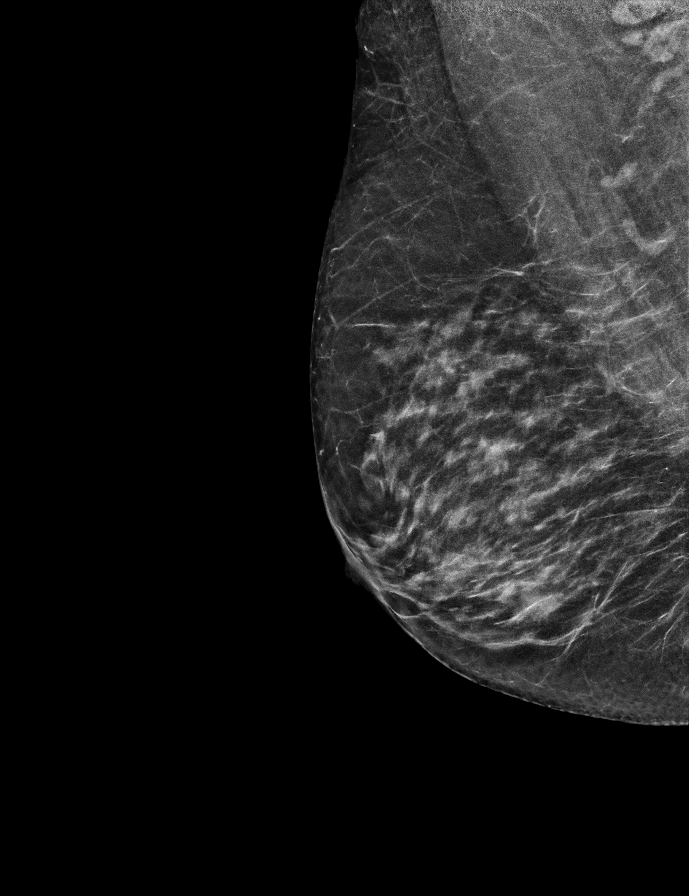

[R CC synth-2D]
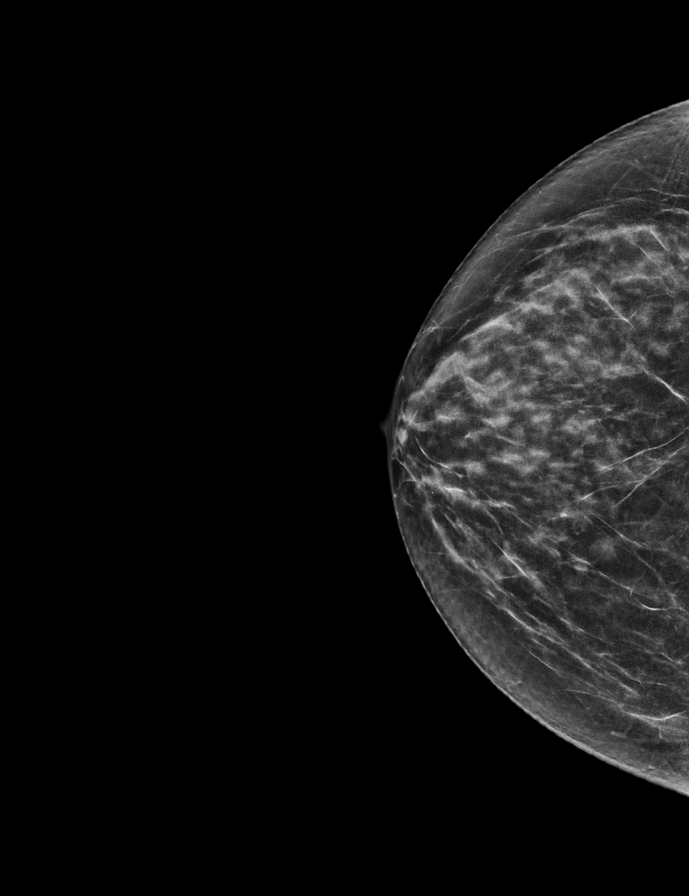

[L MLO synth-2D]
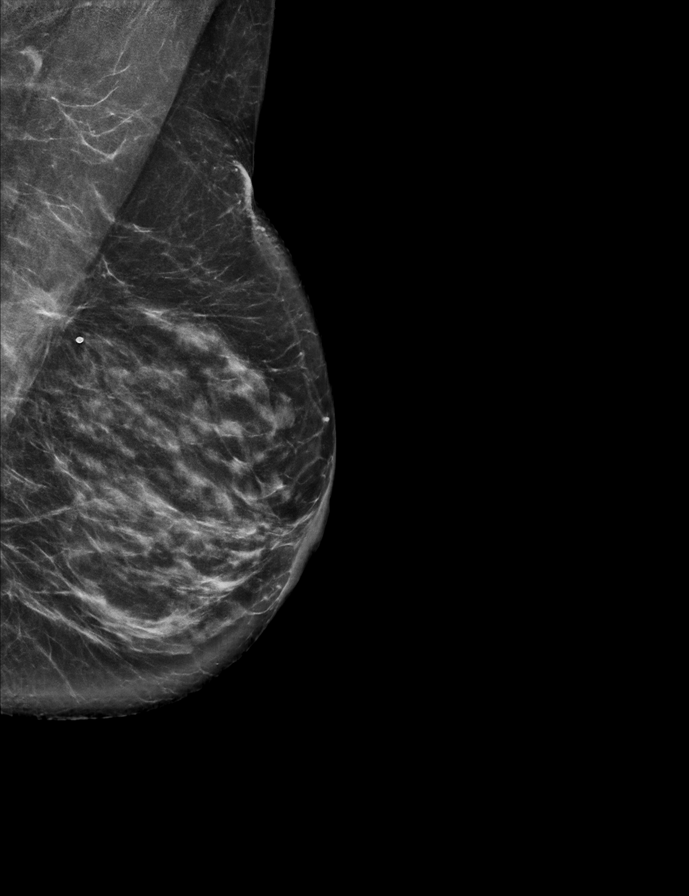

[L CC tomo · 2 of 49 frames shown]
[frame 16/49]
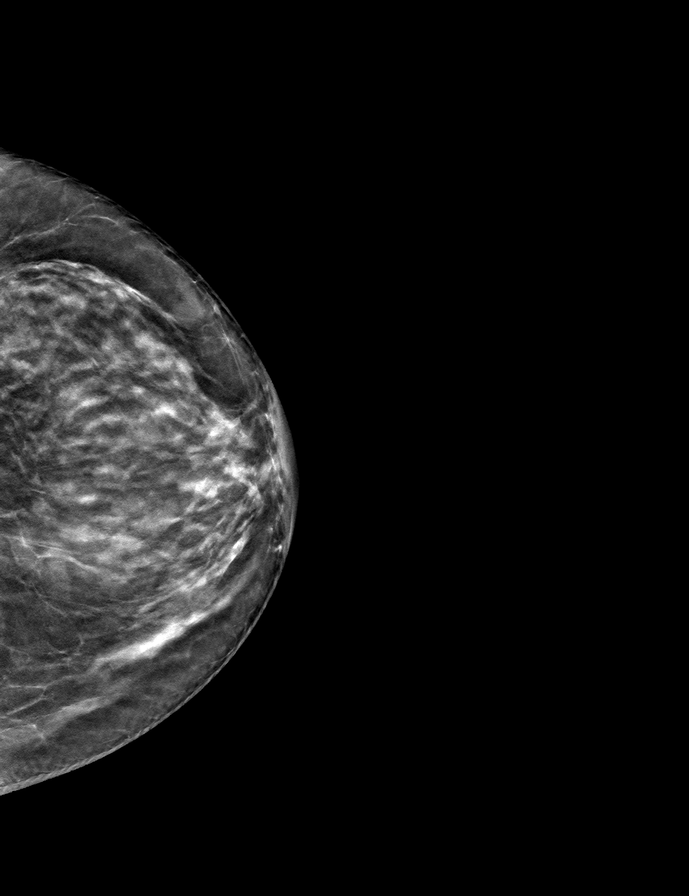
[frame 25/49]
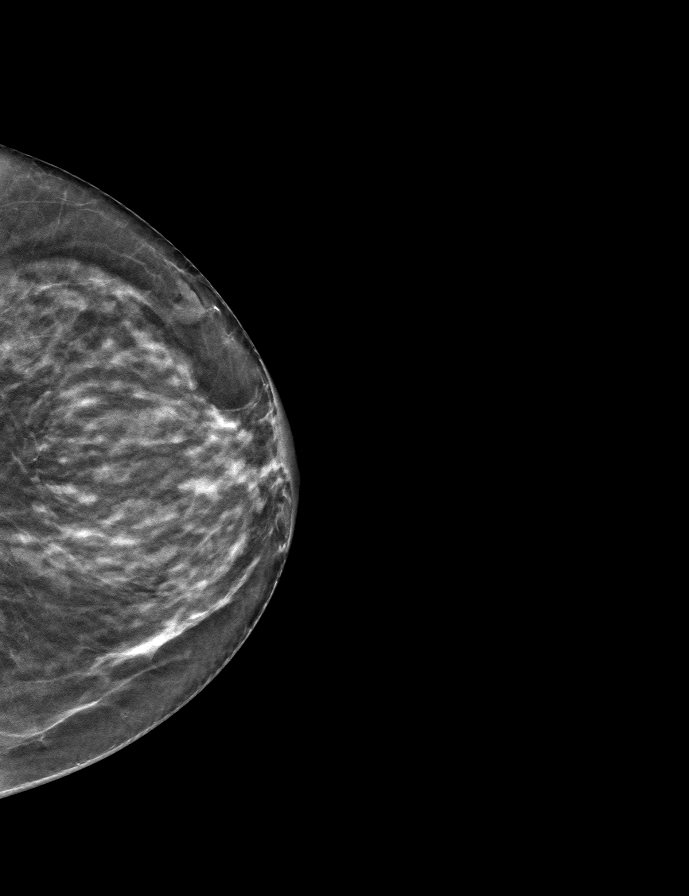

[R CC tomo · tomo slice 27/54.0]
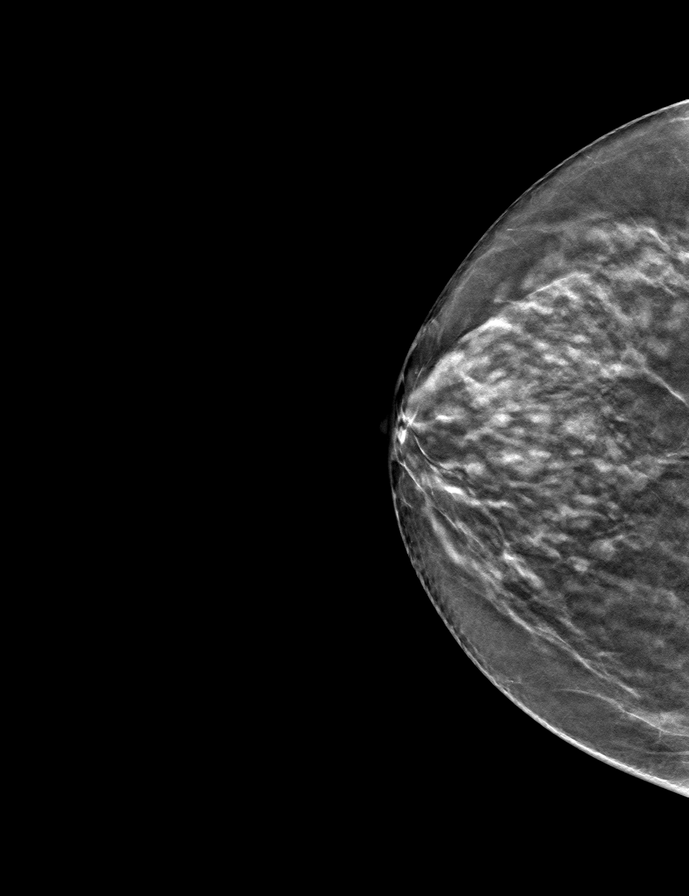

[L MLO tomo · tomo slice 37/72.0]
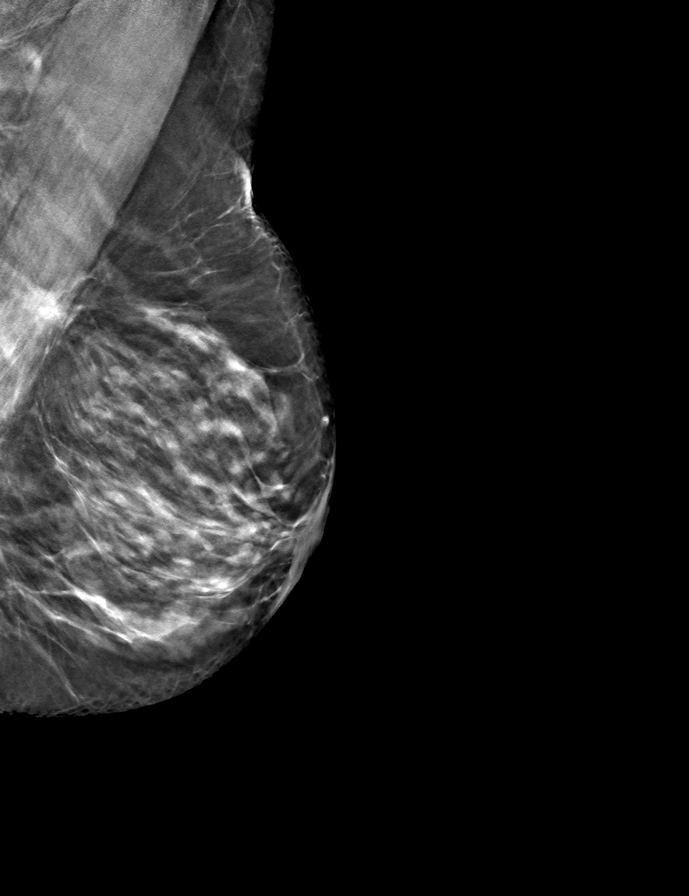

[R MLO tomo · tomo slice 25/49.0]
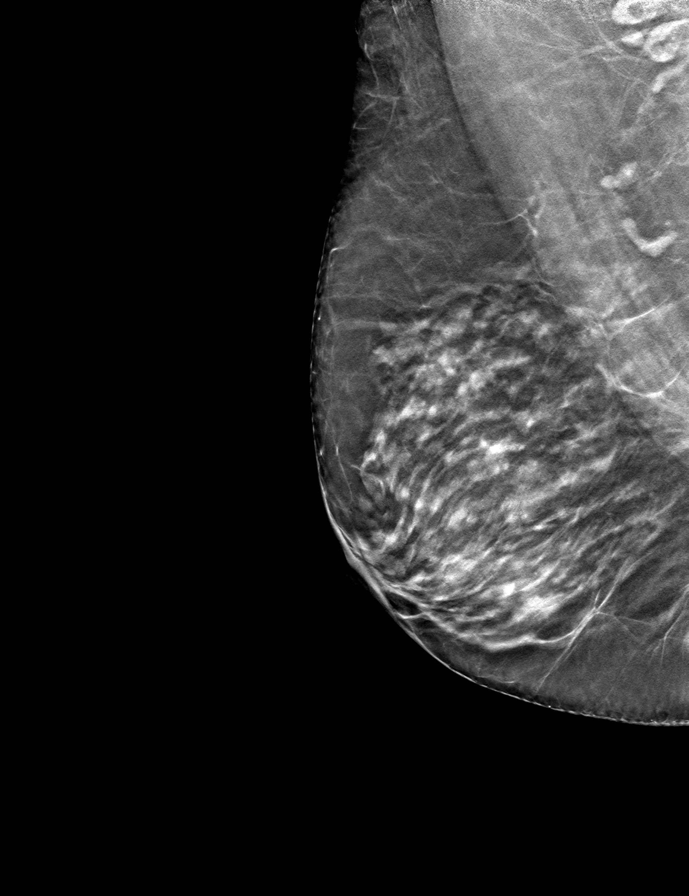

[9 of 24 positions shown; findings below may reference images not displayed]

ACR Breast Density Category c: The breast tissue is heterogeneously
dense, which may obscure small masses.
FINDINGS: In the right breast, a possible asymmetry warrants further
evaluation. In the left breast, no findings suspicious for
malignancy. Images were processed with CAD.
IMPRESSION: Further evaluation is suggested for possible asymmetry in the right
breast.

RECOMMENDATION:
Diagnostic mammogram and possibly ultrasound of the right breast.
(Code:EU-[id number])

The patient will be contacted regarding the findings, and additional
imaging will be scheduled.

BI-RADS CATEGORY  0: Incomplete. Need additional imaging evaluation
and/or prior mammograms for comparison.

## 2022-11-09 ENCOUNTER — Ambulatory Visit: Payer: Medicare Other

## 2022-11-09 DIAGNOSIS — K641 Second degree hemorrhoids: Secondary | ICD-10-CM | POA: Diagnosis not present

## 2022-11-09 DIAGNOSIS — Z09 Encounter for follow-up examination after completed treatment for conditions other than malignant neoplasm: Secondary | ICD-10-CM | POA: Diagnosis not present

## 2022-11-09 DIAGNOSIS — K2270 Barrett's esophagus without dysplasia: Secondary | ICD-10-CM | POA: Diagnosis not present

## 2022-11-09 DIAGNOSIS — K5730 Diverticulosis of large intestine without perforation or abscess without bleeding: Secondary | ICD-10-CM | POA: Diagnosis not present

## 2022-11-09 DIAGNOSIS — K5140 Inflammatory polyps of colon without complications: Secondary | ICD-10-CM | POA: Diagnosis not present

## 2022-11-09 DIAGNOSIS — K449 Diaphragmatic hernia without obstruction or gangrene: Secondary | ICD-10-CM | POA: Diagnosis not present

## 2022-11-09 DIAGNOSIS — Z86010 Personal history of colonic polyps: Secondary | ICD-10-CM | POA: Diagnosis not present
